# Patient Record
Sex: Female | Born: 1965 | Race: White | Hispanic: No | Marital: Married | Smoking: Current some day smoker
Health system: Southern US, Community
[De-identification: ages and names within clinical notes are randomized; demographics above are authoritative.]

## PROBLEM LIST (undated history)

## (undated) DIAGNOSIS — I10 Essential (primary) hypertension: Secondary | ICD-10-CM

## (undated) DIAGNOSIS — E119 Type 2 diabetes mellitus without complications: Secondary | ICD-10-CM

## (undated) DIAGNOSIS — F329 Major depressive disorder, single episode, unspecified: Secondary | ICD-10-CM

## (undated) DIAGNOSIS — F419 Anxiety disorder, unspecified: Secondary | ICD-10-CM

## (undated) DIAGNOSIS — F32A Depression, unspecified: Secondary | ICD-10-CM

## (undated) HISTORY — DX: Essential (primary) hypertension: I10

## (undated) HISTORY — PX: NASAL SINUS SURGERY: SHX719

## (undated) HISTORY — DX: Depression, unspecified: F32.A

## (undated) HISTORY — DX: Type 2 diabetes mellitus without complications: E11.9

## (undated) HISTORY — DX: Anxiety disorder, unspecified: F41.9

## (undated) HISTORY — DX: Major depressive disorder, single episode, unspecified: F32.9

---

## 2013-12-22 ENCOUNTER — Ambulatory Visit: Payer: Self-pay | Admitting: Physician Assistant

## 2014-01-27 ENCOUNTER — Ambulatory Visit: Payer: Self-pay | Admitting: Family Medicine

## 2015-01-24 ENCOUNTER — Ambulatory Visit: Payer: Self-pay | Admitting: Family Medicine

## 2015-02-01 ENCOUNTER — Ambulatory Visit
Admit: 2015-02-01 | Disposition: A | Discharge status: Home / Self Care | Payer: Self-pay | Attending: Family Medicine | Admitting: Family Medicine

## 2015-03-04 ENCOUNTER — Ambulatory Visit
Admit: 2015-03-04 | Disposition: A | Discharge status: Home / Self Care | Payer: Self-pay | Attending: Family Medicine | Admitting: Family Medicine

## 2015-04-16 ENCOUNTER — Emergency Department
Admission: EM | Admit: 2015-04-16 | Discharge: 2015-04-16 | Discharge status: Against Medical Advice | Payer: Self-pay | Attending: Emergency Medicine | Admitting: Emergency Medicine

## 2015-04-16 DIAGNOSIS — R05 Cough: Secondary | ICD-10-CM | POA: Insufficient documentation

## 2015-04-16 DIAGNOSIS — Y998 Other external cause status: Secondary | ICD-10-CM | POA: Insufficient documentation

## 2015-04-16 DIAGNOSIS — S299XXA Unspecified injury of thorax, initial encounter: Secondary | ICD-10-CM | POA: Insufficient documentation

## 2015-04-16 DIAGNOSIS — Y9289 Other specified places as the place of occurrence of the external cause: Secondary | ICD-10-CM | POA: Insufficient documentation

## 2015-04-16 DIAGNOSIS — X58XXXA Exposure to other specified factors, initial encounter: Secondary | ICD-10-CM | POA: Insufficient documentation

## 2015-04-16 DIAGNOSIS — Y9389 Activity, other specified: Secondary | ICD-10-CM | POA: Insufficient documentation

## 2015-04-16 NOTE — ED Notes (Signed)
Pt in with co left rib pain, states was moving around on the cough when she felt a "pop" to left rib area.  Now having pain to area, pain increases with movement and inspiration.

## 2015-06-15 ENCOUNTER — Encounter: Payer: Self-pay | Admitting: Family Medicine

## 2015-06-15 ENCOUNTER — Ambulatory Visit (INDEPENDENT_AMBULATORY_CARE_PROVIDER_SITE_OTHER): Payer: BLUE CROSS/BLUE SHIELD | Admitting: Family Medicine

## 2015-06-15 VITALS — BP 110/88 | HR 80 | Temp 98.3°F | Resp 16 | Ht 61.0 in | Wt 203.2 lb

## 2015-06-15 DIAGNOSIS — E1165 Type 2 diabetes mellitus with hyperglycemia: Secondary | ICD-10-CM | POA: Diagnosis not present

## 2015-06-15 DIAGNOSIS — F329 Major depressive disorder, single episode, unspecified: Secondary | ICD-10-CM | POA: Insufficient documentation

## 2015-06-15 DIAGNOSIS — IMO0002 Reserved for concepts with insufficient information to code with codable children: Secondary | ICD-10-CM | POA: Insufficient documentation

## 2015-06-15 DIAGNOSIS — F988 Other specified behavioral and emotional disorders with onset usually occurring in childhood and adolescence: Secondary | ICD-10-CM | POA: Insufficient documentation

## 2015-06-15 DIAGNOSIS — M5030 Other cervical disc degeneration, unspecified cervical region: Secondary | ICD-10-CM | POA: Insufficient documentation

## 2015-06-15 DIAGNOSIS — F32A Depression, unspecified: Secondary | ICD-10-CM | POA: Insufficient documentation

## 2015-06-15 DIAGNOSIS — F419 Anxiety disorder, unspecified: Secondary | ICD-10-CM | POA: Insufficient documentation

## 2015-06-15 DIAGNOSIS — I10 Essential (primary) hypertension: Secondary | ICD-10-CM | POA: Insufficient documentation

## 2015-06-15 MED ORDER — GLIPIZIDE 5 MG PO TABS: 5.0000 mg | ORAL_TABLET | Freq: Two times a day (BID) | ORAL | Status: DC

## 2015-06-15 NOTE — Patient Instructions (Signed)
Stop metformin 

## 2015-06-15 NOTE — Progress Notes (Signed)
Subjective:     Patient ID: Michelle Hendricks, female   DOB: 1966/09/29, 49 y.o.   MRN: [id number]  HPI  Chief Complaint  Patient presents with  . Emesis    Patient comes in office today with complaints of nausea, vomiting and diarrhea. She states that she had two episodes this morning of vomiting and that since she had her metformin increased in June she has experienced increased urination at night and leg cramping. Patient reports that once Metformin was increased she had received a steroid shot and since having that shot she has had G.I upset. Patient states that her fasting blood sugar this morning was 209  She has generally not felt well since metformin increased in May. States she may move her bowels up to 6 x day and they are generally not formed. Prior bowel pattern was 1-2 x day.   Review of Systems  Constitutional: Negative for fever and chills.       Objective:   Physical Exam  Constitutional: She appears well-developed and well-nourished. No distress.  Pulmonary/Chest: Breath sounds normal.  Abdominal: Bowel sounds are normal. There is tenderness (diffuse). There is no guarding.       Assessment:    1. Diabetes type 2, uncontrolled-intolerable side effects to metformin - glipiZIDE (GLUCOTROL) 5 MG tablet; Take 1 tablet (5 mg total) by mouth 2 (two) times daily before a meal.  Dispense: 60 tablet; Refill:     Plan:    Stop metformin; start sulfonylurea, f/u with primary provider (or phone f/u) in one week.

## 2015-06-16 ENCOUNTER — Telehealth: Payer: Self-pay | Admitting: Family Medicine

## 2015-06-16 NOTE — Telephone Encounter (Signed)
Pt came in yesterday.  Changed diabetes mediation and her blood sugar this morning has been 191 fasting and 269 after breakfast.  Please call her back.  Thanks tp

## 2015-06-16 NOTE — Telephone Encounter (Signed)
Pt called back because she is still very concerned about the elevated levels. Thanks TNP

## 2015-06-16 NOTE — Telephone Encounter (Signed)
Yes it may take a week for sugars to show improvement and she will be following up with Michelle Hendricks then. I am interested in knowing if her G.I. Sx are improving off metformin?

## 2015-06-16 NOTE — Telephone Encounter (Signed)
Since patient just started medication Im assuming it will take a while for her body to adjust to medication. Please advise on what I should tell patient

## 2015-06-17 NOTE — Telephone Encounter (Signed)
Spoke with patient on the phone she states that her G.I symptoms have improved off Metformin. She states her blood sugar yesterday was >230 but today she says her blood sugar is back to normal at 165 and she is feeling a lot better. Patient advised to follow up with Maurine Ministerennis as suggested and if she has any further concerns to call office back.

## 2015-07-07 ENCOUNTER — Encounter: Payer: Self-pay | Admitting: Family Medicine

## 2015-07-07 ENCOUNTER — Ambulatory Visit (INDEPENDENT_AMBULATORY_CARE_PROVIDER_SITE_OTHER): Payer: BLUE CROSS/BLUE SHIELD | Admitting: Family Medicine

## 2015-07-07 ENCOUNTER — Other Ambulatory Visit: Payer: Self-pay

## 2015-07-07 VITALS — BP 116/72 | HR 84 | Temp 98.1°F | Resp 16 | Wt 203.0 lb

## 2015-07-07 DIAGNOSIS — IMO0002 Reserved for concepts with insufficient information to code with codable children: Secondary | ICD-10-CM

## 2015-07-07 DIAGNOSIS — E1165 Type 2 diabetes mellitus with hyperglycemia: Secondary | ICD-10-CM

## 2015-07-07 DIAGNOSIS — I10 Essential (primary) hypertension: Secondary | ICD-10-CM

## 2015-07-07 NOTE — Progress Notes (Signed)
Patient ID: Michelle Hendricks, female   DOB: 1966-04-13, 49 y.o.   MRN: [id number] .   Chief Complaint  Patient presents with  . Follow-up    diabetes    Subjective:  HPI  This 49 year old female stopped Metformin due to side effects and switched to Glipizide 5 mg BID. Denies numbness/pain in extremities, hypoglycemic attacks, polyuria, polydipsia or polyphagia. Blood sugar had been 150-170 but yesterday FBS elevated to 201 and spiked to 231 in the afternoon. FBS 186 this morning. Feeling dizzy yesterday but not today. Primarily fatigued today.   Prior to Admission medications   Medication Sig Start Date End Date Taking? Authorizing Provider  citalopram (CELEXA) 20 MG tablet Take 20 mg by mouth at bedtime. 06/07/15  Yes Historical Provider, MD  glipiZIDE (GLUCOTROL) 5 MG tablet Take 1 tablet (5 mg total) by mouth 2 (two) times daily before a meal. 06/15/15  Yes Anola Gurney, PA  ibuprofen (ADVIL,MOTRIN) 800 MG tablet as needed 04/08/15  Yes Historical Provider, MD  lisinopril-hydrochlorothiazide (PRINZIDE,ZESTORETIC) 20-25 MG per tablet Take 1 tablet by mouth daily. 06/07/15  Yes Historical Provider, MD  naproxen (NAPROSYN) 500 MG tablet TAKE 1 TABLET BY ORAL ROUTE 2 TIMES EVERY DAY WITH FOOD 04/17/15  Yes Historical Provider, MD  Letta Pate VERIO test strip USE TO TEST BLOOD SUGAR DAILY 05/03/15  Yes Historical Provider, MD    Patient Active Problem List   Diagnosis Date Noted  . Diabetes type 2, uncontrolled 06/15/2015  . Depression 06/15/2015  . Anxiety 06/15/2015  . Hypertension 06/15/2015  . DDD (degenerative disc disease), cervical 06/15/2015  . ADD (attention deficit disorder) 06/15/2015    Past Medical History  Diagnosis Date  . Anxiety   . Depression   . Hypertension   . Diabetes mellitus without complication     History   Social History  . Marital Status: Single    Spouse Name: N/A  . Number of Children: N/A  . Years of Education: N/A   Occupational History  . Not on  file.   Social History Main Topics  . Smoking status: Never Smoker   . Smokeless tobacco: Not on file  . Alcohol Use: Not on file  . Drug Use: Not on file  . Sexual Activity: Not on file   Other Topics Concern  . Not on file   Social History Narrative    Allergies  Allergen Reactions  . Penicillins Rash   Review of Systems  Constitutional: Negative.   HENT: Negative.   Eyes: Negative.   Respiratory: Negative.   Cardiovascular: Negative.   Genitourinary: Negative.   Skin: Negative.   Endo/Heme/Allergies: Negative.     There is no immunization history on file for this patient. Objective:  BP 116/72 mmHg  Pulse 84  Temp(Src) 98.1 F (36.7 C) (Oral)  Resp 16  Wt 203 lb (92.08 kg)  LMP  (Within Months)  Physical Exam  Constitutional: She is oriented to person, place, and time and well-developed, well-nourished, and in no distress.  HENT:  Head: Normocephalic and atraumatic.  Eyes: EOM are normal. Pupils are equal, round, and reactive to light.  Cardiovascular: Normal rate and regular rhythm.   Pulmonary/Chest: Effort normal.  Abdominal: Soft. Bowel sounds are normal.  Neurological: She is alert and oriented to person, place, and time.  Skin: No rash noted.  Psychiatric: Memory, affect and judgment normal.   Assessment and Plan :   1. Diabetes type 2, uncontrolled Stable on the Glipizide until receiving steroid injections  in her neck recently. Recommend exercise and diabetic diet. Continue Glipizide 5 mg BID. Recheck labs. May need additional medications. Recheck pending reports. - Hemoglobin A1c - CBC with Differential/Platelet - Comprehensive metabolic panel - Lipid panel  2. Essential hypertension Good control on the Lisionpril-HCTZ 20/25 mg qd. No side effects. Recheck routine labs and lipids. Recheck pending reports.   Julieanne Manson MD [facility] 07/07/2015 11:18 AM

## 2015-07-08 ENCOUNTER — Telehealth: Payer: Self-pay | Admitting: Family Medicine

## 2015-07-08 DIAGNOSIS — IMO0002 Reserved for concepts with insufficient information to code with codable children: Secondary | ICD-10-CM

## 2015-07-08 DIAGNOSIS — E1165 Type 2 diabetes mellitus with hyperglycemia: Secondary | ICD-10-CM

## 2015-07-08 LAB — CBC WITH DIFFERENTIAL/PLATELET
Basophils Absolute: 0 10*3/uL (ref 0.0–0.2)
Basos: 0 %
EOS (ABSOLUTE): 0.1 10*3/uL (ref 0.0–0.4)
EOS: 1 %
Hematocrit: 37.6 % (ref 34.0–46.6)
Hemoglobin: 13.1 g/dL (ref 11.1–15.9)
Immature Grans (Abs): 0 10*3/uL (ref 0.0–0.1)
Immature Granulocytes: 0 %
LYMPHS ABS: 2 10*3/uL (ref 0.7–3.1)
Lymphs: 22 %
MCH: 30.5 pg (ref 26.6–33.0)
MCHC: 34.8 g/dL (ref 31.5–35.7)
MCV: 87 fL (ref 79–97)
MONOCYTES: 5 %
MONOS ABS: 0.4 10*3/uL (ref 0.1–0.9)
Neutrophils Absolute: 6.5 10*3/uL (ref 1.4–7.0)
Neutrophils: 72 %
Platelets: 296 10*3/uL (ref 150–379)
RBC: 4.3 x10E6/uL (ref 3.77–5.28)
RDW: 15 % (ref 12.3–15.4)
WBC: 9.1 10*3/uL (ref 3.4–10.8)

## 2015-07-08 LAB — LIPID PANEL
Chol/HDL Ratio: 8.7 ratio units — ABNORMAL HIGH (ref 0.0–4.4)
Cholesterol, Total: 183 mg/dL (ref 100–199)
HDL: 21 mg/dL — ABNORMAL LOW (ref >39)
Triglycerides: 1134 mg/dL (ref 0–149)

## 2015-07-08 LAB — COMPREHENSIVE METABOLIC PANEL
ALK PHOS: 73 IU/L (ref 39–117)
ALT: 17 IU/L (ref 0–32)
AST: 18 IU/L (ref 0–40)
Albumin/Globulin Ratio: 1.5 (ref 1.1–2.5)
Albumin: 4 g/dL (ref 3.5–5.5)
BUN / CREAT RATIO: 18 (ref 9–23)
BUN: 12 mg/dL (ref 6–24)
CHLORIDE: 97 mmol/L (ref 97–108)
CO2: 20 mmol/L (ref 18–29)
CREATININE: 0.66 mg/dL (ref 0.57–1.00)
Calcium: 8.8 mg/dL (ref 8.7–10.2)
GFR calc Af Amer: 122 mL/min/{1.73_m2} (ref >59)
GFR, EST NON AFRICAN AMERICAN: 106 mL/min/{1.73_m2} (ref >59)
GLUCOSE: 180 mg/dL — AB (ref 65–99)
Globulin, Total: 2.7 g/dL (ref 1.5–4.5)
Potassium: 4.3 mmol/L (ref 3.5–5.2)
Sodium: 136 mmol/L (ref 134–144)
TOTAL PROTEIN: 6.7 g/dL (ref 6.0–8.5)

## 2015-07-08 LAB — HEMOGLOBIN A1C
Est. average glucose Bld gHb Est-mCnc: 177 mg/dL
Hgb A1c MFr Bld: 7.8 % — ABNORMAL HIGH (ref 4.8–5.6)

## 2015-07-08 NOTE — Telephone Encounter (Signed)
Pt called for test results

## 2015-07-08 NOTE — Telephone Encounter (Signed)
Sorry for the mistake. Should increase Glipizide to 5 mg to 3 times a day. Proceed with treatment of high triglycerides with the Fenofibrate and Metamucil. Getting the blood sugar down may lower the triglycerides also. Recheck levels in 6-8 week.

## 2015-07-08 NOTE — Telephone Encounter (Signed)
-----   Message from Tamsen Roers, [place] sent at 07/08/2015  6:04 PM EDT ----- Hgb A1C high at 7.8 and glucose high at 180. Very good kidney and liver function tests. Total cholesterol is normal. HDL cholesterol very low and triglycerides extremely high. Need to increase Metformin to 1000 mg BID, add Fenofibrate 160 mg qd #30 & 3 RF and Metamucil 1 teaspoon in 4-6 ounces of juice or water. Watch diet closely and lose weight. Recheck levels in 6-8 weeks.

## 2015-07-08 NOTE — Telephone Encounter (Signed)
Advised pt of the note below, pt stated that she is not taking metformin, she said that she is taking glypizide 5 mg. Please advise.

## 2015-07-11 MED ORDER — GLIPIZIDE 5 MG PO TABS: 5.0000 mg | ORAL_TABLET | Freq: Three times a day (TID) | ORAL | Status: DC

## 2015-07-11 NOTE — Telephone Encounter (Signed)
Patient advised as directed below. Patient verbalized understanding and agrees with treatment plan. Patient requested a new RX reflecting dose increase be sent to pharmacy. RX sent to [facility].

## 2015-08-02 ENCOUNTER — Telehealth: Payer: Self-pay | Admitting: Family Medicine

## 2015-08-02 NOTE — Telephone Encounter (Signed)
Patient states her blood sugars are elevated between 200-260 and some days up to 300. Patient states when her sugars do drop they drop to fast and decrease to 70-100. Patient states she is really tired and fatigue.   Patient states typically when she starts new medications she has good symptom control then the medications tend to become less effective over time.  Please advise.

## 2015-08-02 NOTE — Telephone Encounter (Signed)
Pt called saying she does not think the glipizide is not working any longer.  She said she has been getting higher sugar reading lately.  Please call her back  [phone].  Thanks Barth Kirks

## 2015-08-03 NOTE — Telephone Encounter (Signed)
Pt called wanting to know what the doctor wants her to do.  Please advise.  Sugar is not as high today, but she still feels bad.    Her call back [phone]   Thanks teri

## 2015-08-03 NOTE — Telephone Encounter (Signed)
Patient advised Michelle Hendricks is out of the office today. Patient states her blood sugar is not elevated today. Advised patient that we would call her on Thursday with a recommendation. Please advise.

## 2015-08-04 MED ORDER — SAXAGLIPTIN HCL 2.5 MG PO TABS: 2.5000 mg | ORAL_TABLET | Freq: Every day | ORAL | Status: DC

## 2015-08-04 NOTE — Telephone Encounter (Signed)
Pt stated that her sugar this am was 200 fasting and that if keeps going up. Pt stated that she is taking glipizide 5 mg daily before meals. She wants to know what to do. Please advise.  Thanks,

## 2015-08-04 NOTE — Telephone Encounter (Signed)
Having light headed/dizzy sensations since taking the Glipizide. Had too much GI side effects to take the Metformin. Will discontinue the Glipizide and give trial of Onglyza  2.5 mg qd. Recheck appointment in 5-7 days. Patient agrees and will continue to check FBS each morning with recheck during the day if any dizziness.

## 2015-08-04 NOTE — Telephone Encounter (Signed)
Pt is requesting a call back.  UJ#[phone]/FA

## 2015-08-11 ENCOUNTER — Other Ambulatory Visit: Payer: Self-pay

## 2015-08-12 ENCOUNTER — Ambulatory Visit (INDEPENDENT_AMBULATORY_CARE_PROVIDER_SITE_OTHER): Payer: BLUE CROSS/BLUE SHIELD | Admitting: Family Medicine

## 2015-08-12 ENCOUNTER — Encounter: Payer: Self-pay | Admitting: Family Medicine

## 2015-08-12 VITALS — BP 122/72 | HR 110 | Temp 98.4°F | Resp 18 | Wt 204.2 lb

## 2015-08-12 DIAGNOSIS — E1165 Type 2 diabetes mellitus with hyperglycemia: Secondary | ICD-10-CM | POA: Diagnosis not present

## 2015-08-12 DIAGNOSIS — Z634 Disappearance and death of family member: Secondary | ICD-10-CM

## 2015-08-12 DIAGNOSIS — IMO0002 Reserved for concepts with insufficient information to code with codable children: Secondary | ICD-10-CM

## 2015-08-12 MED ORDER — SAXAGLIPTIN HCL 2.5 MG PO TABS: 5.0000 mg | ORAL_TABLET | Freq: Every day | ORAL | Status: DC

## 2015-08-12 MED ORDER — LORAZEPAM 0.5 MG PO TABS: 0.5000 mg | ORAL_TABLET | Freq: Three times a day (TID) | ORAL | Status: DC | PRN

## 2015-08-12 NOTE — Progress Notes (Addendum)
Patient ID: Michelle Hendricks, female   DOB: 07-07-1966, 49 y.o.   MRN: [id number]    Subjective:  Diabetes She presents for her follow-up diabetic visit. She has type 2 diabetes mellitus. Her disease course has been fluctuating. Hypoglycemia symptoms include nervousness/anxiousness.  FBS has been ranging from 150-211 over the past week. Tolerating the Onglyza 2.5 mg qd without side effects. Appetite has been up and down since father died in [place] last week from a heart attack unexpectedly.   Prior to Admission medications   Medication Sig Start Date End Date Taking? Authorizing Provider  citalopram (CELEXA) 20 MG tablet Take 20 mg by mouth at bedtime. 06/07/15  Yes Historical Provider, MD  ibuprofen (ADVIL,MOTRIN) 800 MG tablet as needed 04/08/15  Yes Historical Provider, MD  lisinopril-hydrochlorothiazide (PRINZIDE,ZESTORETIC) 20-25 MG per tablet Take 1 tablet by mouth daily. 06/07/15  Yes Historical Provider, MD  naproxen (NAPROSYN) 500 MG tablet TAKE 1 TABLET BY ORAL ROUTE 2 TIMES EVERY DAY WITH FOOD 04/17/15  Yes Historical Provider, MD  Letta Pate VERIO test strip USE TO TEST BLOOD SUGAR DAILY 05/03/15  Yes Historical Provider, MD  saxagliptin HCl (ONGLYZA) 2.5 MG TABS tablet Take 1 tablet (2.5 mg total) by mouth daily. 08/04/15  Yes Tamsen Roers, PA    Patient Active Problem List   Diagnosis Date Noted  . Diabetes type 2, uncontrolled 06/15/2015  . Depression 06/15/2015  . Anxiety 06/15/2015  . Hypertension 06/15/2015  . DDD (degenerative disc disease), cervical 06/15/2015  . ADD (attention deficit disorder) 06/15/2015    Past Medical History  Diagnosis Date  . Anxiety   . Depression   . Hypertension   . Diabetes mellitus without complication    Past Surgical History  Procedure Laterality Date  . Nasal sinus surgery     Family History  Problem Relation Age of Onset  . Alzheimer's disease Maternal Grandmother   . Cancer Maternal Grandfather   . Diabetes Maternal Grandfather     . Heart attack Father    Social History   Social History  . Marital Status: Single    Spouse Name: N/A  . Number of Children: N/A  . Years of Education: N/A   Occupational History  . Not on file.   Social History Main Topics  . Smoking status: Never Smoker   . Smokeless tobacco: Not on file  . Alcohol Use: Not on file  . Drug Use: Not on file  . Sexual Activity: Not on file   Other Topics Concern  . Not on file   Social History Narrative    Allergies  Allergen Reactions  . Penicillins Rash    Review of Systems  Constitutional: Positive for malaise/fatigue.  HENT: Negative.   Respiratory: Negative.   Cardiovascular: Negative.   Gastrointestinal: Negative.   Psychiatric/Behavioral: The patient is nervous/anxious.        Father died suddenly from heart attack last week at his home in [place].   Objective:  BP 122/72 mmHg  Pulse 110  Temp(Src) 98.4 F (36.9 C) (Oral)  Resp 18  Wt 204 lb 3.2 oz (92.625 kg)  SpO2 95%  Physical Exam  Constitutional: She is well-developed, well-nourished, and in no distress.  HENT:  Head: Normocephalic.  Eyes: Conjunctivae are normal.  Neck: Normal range of motion. Neck supple.  Cardiovascular: Normal rate and regular rhythm.   Pulmonary/Chest: Breath sounds normal.  Abdominal: Soft. Bowel sounds are normal.  Musculoskeletal: Normal range of motion.  Skin: No rash noted.  Psychiatric: Memory  and judgment normal. Her mood appears anxious. She exhibits a depressed mood. She expresses no suicidal ideation. She expresses no suicidal plans. She has a flat affect.    Lab Results  Component Value Date   WBC 9.1 07/07/2015   HCT 37.6 07/07/2015   GLUCOSE 180* 07/07/2015   CHOL 183 07/07/2015   TRIG 1134* 07/07/2015   HDL 21* 07/07/2015   LDLCALC Comment 07/07/2015   HGBA1C 7.8* 07/07/2015    CMP     Component Value Date/Time   NA 136 07/07/2015 1415   K 4.3 07/07/2015 1415   CL 97 07/07/2015 1415   CO2 20 07/07/2015  1415   GLUCOSE 180* 07/07/2015 1415   BUN 12 07/07/2015 1415   CREATININE 0.66 07/07/2015 1415   CALCIUM 8.8 07/07/2015 1415   PROT 6.7 07/07/2015 1415   AST 18 07/07/2015 1415   ALT 17 07/07/2015 1415   ALKPHOS 73 07/07/2015 1415   BILITOT <0.2 07/07/2015 1415   GFRNONAA 106 07/07/2015 1415   GFRAA 122 07/07/2015 1415    Assessment and Plan :  1. Diabetes type 2, uncontrolled Tolerating Onglyza but having difficulty maintaining diet with recent death of her father. Will increase Onglyza to 5 mg qd and recheck in 1 months. Encouraged to maintain proper diabetic diet. - saxagliptin HCl (ONGLYZA) 2.5 MG TABS tablet; Take 2 tablets (5 mg total) by mouth daily.  Dispense: 21 tablet; Refill: 0  2. Bereavement Frequent anxiety and spontaneous crying spells. Will give Lorazepam to calm nervousness/anxiety. Should help with sleep but must be aware of drowsiness from this medication before attempting to drive. Recheck prn. - LORazepam (ATIVAN) 0.5 MG tablet; Take 1 tablet (0.5 mg total) by mouth 3 (three) times daily as needed for anxiety.  Dispense: 30 tablet; Refill: 1   Sheliah Plane [facility] 08/12/2015 3:04 PM

## 2015-08-23 ENCOUNTER — Telehealth: Payer: Self-pay | Admitting: Family Medicine

## 2015-08-23 NOTE — Telephone Encounter (Signed)
Offered to add SGLT-2 to her DPP-4 since she did not tolerate the GI upset from the Metformin. Also, offered to start Lantus or Toujeo. States she wants to set up her own appointment with an endocrinologist before trying other treatments. Advised her we could help with scheduling referral but she was sure her insurance would allow her to do it herself.

## 2015-08-23 NOTE — Telephone Encounter (Signed)
Pt called to say her blood sugar reading have been high today/  Fasting 264,   After breakfast  203  , after lunch 271.  Please advise  Call back is [phone]  Thanks teri

## 2015-08-24 ENCOUNTER — Telehealth: Payer: Self-pay | Admitting: Family Medicine

## 2015-08-24 DIAGNOSIS — E1165 Type 2 diabetes mellitus with hyperglycemia: Secondary | ICD-10-CM

## 2015-08-24 DIAGNOSIS — IMO0002 Reserved for concepts with insufficient information to code with codable children: Secondary | ICD-10-CM

## 2015-08-24 NOTE — Telephone Encounter (Signed)
Pt is requesting referral to see Dr Tedd Sias for diabetes.She states that she spoke to you about this yesterday

## 2015-08-25 NOTE — Telephone Encounter (Signed)
Information faxed to Dr Tedd Sias.Her office will contact pt

## 2015-08-25 NOTE — Telephone Encounter (Signed)
Uncontrolled diabetes despite trials of Metformin (stopped due to side effects) and Onglyza. Did not want to start insulin without seeing a diabetic specialist first. Felt her insurance would allow her to set her own appointment when we talked about it earlier this week. Please schedule appointment.

## 2015-09-08 ENCOUNTER — Other Ambulatory Visit: Payer: Self-pay | Admitting: Family Medicine

## 2015-11-11 ENCOUNTER — Emergency Department
Admission: EM | Admit: 2015-11-11 | Discharge: 2015-11-11 | Disposition: A | Discharge status: Home / Self Care | Payer: BLUE CROSS/BLUE SHIELD | Attending: Emergency Medicine | Admitting: Emergency Medicine

## 2015-11-11 ENCOUNTER — Encounter: Payer: Self-pay | Admitting: Emergency Medicine

## 2015-11-11 DIAGNOSIS — E1165 Type 2 diabetes mellitus with hyperglycemia: Secondary | ICD-10-CM | POA: Diagnosis not present

## 2015-11-11 DIAGNOSIS — Z3202 Encounter for pregnancy test, result negative: Secondary | ICD-10-CM | POA: Diagnosis not present

## 2015-11-11 DIAGNOSIS — F17210 Nicotine dependence, cigarettes, uncomplicated: Secondary | ICD-10-CM | POA: Insufficient documentation

## 2015-11-11 DIAGNOSIS — Z791 Long term (current) use of non-steroidal anti-inflammatories (NSAID): Secondary | ICD-10-CM | POA: Diagnosis not present

## 2015-11-11 DIAGNOSIS — Z880 Allergy status to penicillin: Secondary | ICD-10-CM | POA: Diagnosis not present

## 2015-11-11 DIAGNOSIS — I10 Essential (primary) hypertension: Secondary | ICD-10-CM | POA: Insufficient documentation

## 2015-11-11 DIAGNOSIS — R739 Hyperglycemia, unspecified: Secondary | ICD-10-CM

## 2015-11-11 DIAGNOSIS — Z79899 Other long term (current) drug therapy: Secondary | ICD-10-CM | POA: Insufficient documentation

## 2015-11-11 LAB — URINALYSIS COMPLETE WITH MICROSCOPIC (ARMC ONLY)
BILIRUBIN URINE: NEGATIVE
Hgb urine dipstick: NEGATIVE
Ketones, ur: NEGATIVE mg/dL
Leukocytes, UA: NEGATIVE
NITRITE: NEGATIVE
Protein, ur: NEGATIVE mg/dL
Specific Gravity, Urine: 1.014 (ref 1.005–1.030)
pH: 6 (ref 5.0–8.0)

## 2015-11-11 LAB — BASIC METABOLIC PANEL WITH GFR
Anion gap: 11 (ref 5–15)
BUN: 16 mg/dL (ref 6–20)
CO2: 25 mmol/L (ref 22–32)
Calcium: 9.6 mg/dL (ref 8.9–10.3)
Chloride: 99 mmol/L — ABNORMAL LOW (ref 101–111)
Creatinine, Ser: 0.73 mg/dL (ref 0.44–1.00)
GFR calc Af Amer: >60 mL/min (ref >60)
GFR calc non Af Amer: >60 mL/min (ref >60)
Glucose, Bld: 272 mg/dL — ABNORMAL HIGH (ref 65–99)
Potassium: 4.8 mmol/L (ref 3.5–5.1)
Sodium: 135 mmol/L (ref 135–145)

## 2015-11-11 LAB — CBC WITH DIFFERENTIAL/PLATELET
Basophils Absolute: 0 K/uL (ref 0–0.1)
Basophils Relative: 1 %
Eosinophils Absolute: 0.1 K/uL (ref 0–0.7)
Eosinophils Relative: 1 %
HCT: 43 % (ref 35.0–47.0)
Hemoglobin: 14.4 g/dL (ref 12.0–16.0)
Lymphocytes Relative: 23 %
Lymphs Abs: 1.7 K/uL (ref 1.0–3.6)
MCH: 29.4 pg (ref 26.0–34.0)
MCHC: 33.4 g/dL (ref 32.0–36.0)
MCV: 88.2 fL (ref 80.0–100.0)
Monocytes Absolute: 0.3 K/uL (ref 0.2–0.9)
Monocytes Relative: 5 %
Neutro Abs: 5.2 K/uL (ref 1.4–6.5)
Neutrophils Relative %: 70 %
Platelets: 282 K/uL (ref 150–440)
RBC: 4.88 MIL/uL (ref 3.80–5.20)
RDW: 14.3 % (ref 11.5–14.5)
WBC: 7.4 K/uL (ref 3.6–11.0)

## 2015-11-11 LAB — POCT PREGNANCY, URINE: PREG TEST UR: NEGATIVE

## 2015-11-11 LAB — GLUCOSE, CAPILLARY
Glucose-Capillary: 274 mg/dL — ABNORMAL HIGH (ref 65–99)
Glucose-Capillary: 162 mg/dL — ABNORMAL HIGH (ref 65–99)

## 2015-11-11 MED ORDER — INSULIN ASPART 100 UNIT/ML ~~LOC~~ SOLN
6.0000 [IU] | Freq: Once | SUBCUTANEOUS | Status: AC
  Administered 2015-11-11: 6 [IU] via SUBCUTANEOUS
  Filled 2015-11-11: qty 6

## 2015-11-11 MED ORDER — SODIUM CHLORIDE 0.9 % IV BOLUS (SEPSIS)
1000.0000 mL | Freq: Once | INTRAVENOUS | Status: AC
  Administered 2015-11-11: 1000 mL via INTRAVENOUS

## 2015-11-11 NOTE — Discharge Instructions (Signed)
Call your diabetic doctor and follow up with them today for further management of your diabetes. Return to the emergency room for high fever or persistent vomiting or if you feel worse in any way

## 2015-11-11 NOTE — ED Provider Notes (Signed)
[facility] Emergency Department Provider Note  ____________________________________________   I have reviewed the triage vital signs and the nursing notes.   HISTORY  Chief Complaint Hyperglycemia    HPI Michelle Hendricks is a 49 y.o. female who is a known diabetic, diagnosed last March, failed oral medications and has not been started on insulin. Fortunately, she was for some reason unable to tolerate one of the insulin complications that she was on because she was getting a yeast infection, so they took her off that and started her only on an evening dose of 15 units of a long-acting insulin. That was this week. Patient's noticed since that time her sugars a bit high. Sometimes when right ear sometimes over 300. That her body can tell the difference in her sugars are high. She has had no fever no chills no nausea no vomiting or abdominal pain or dysuria no urinary frequency no vaginal discharge denies pregnancy and does not feel otherwise unwell also for the fact that she feels that her sugar is poorly controlled. She does have an endocrinologist with him she has been working, however she was unable to get ahold of that person so she came to the emergency department.    Past Medical History  Diagnosis Date  . Anxiety   . Depression   . Hypertension   . Diabetes mellitus without complication [facility])     Patient Active Problem List   Diagnosis Date Noted  . Diabetes type 2, uncontrolled (HCC) 06/15/2015  . Depression 06/15/2015  . Anxiety 06/15/2015  . Hypertension 06/15/2015  . DDD (degenerative disc disease), cervical 06/15/2015  . ADD (attention deficit disorder) 06/15/2015    Past Surgical History  Procedure Laterality Date  . Nasal sinus surgery      Current Outpatient Rx  Name  Route  Sig  Dispense  Refill  . citalopram (CELEXA) 20 MG tablet      TAKE 1 TABLET BY MOUTH AT BEDTIME   30 tablet   3   . ibuprofen (ADVIL,MOTRIN) 800 MG tablet     as needed      0   . lisinopril-hydrochlorothiazide (PRINZIDE,ZESTORETIC) 20-25 MG tablet      TAKE 1 TABLET BY MOUTH EVERY DAY   30 tablet   3   . LORazepam (ATIVAN) 0.5 MG tablet   Oral   Take 1 tablet (0.5 mg total) by mouth 3 (three) times daily as needed for anxiety.   30 tablet   1   . naproxen (NAPROSYN) 500 MG tablet      TAKE 1 TABLET BY ORAL ROUTE 2 TIMES EVERY DAY WITH FOOD      0   . ONETOUCH VERIO test strip      USE TO TEST BLOOD SUGAR DAILY      3     Dispense as written.   . saxagliptin HCl (ONGLYZA) 2.5 MG TABS tablet   Oral   Take 2 tablets (5 mg total) by mouth daily.   21 tablet   0     LOT# [id number] EXP Feb.2017     Allergies Penicillins  Family History  Problem Relation Age of Onset  . Alzheimer's disease Maternal Grandmother   . Cancer Maternal Grandfather   . Diabetes Maternal Grandfather   . Heart attack Father     Social History Social History  Substance Use Topics  . Smoking status: Current Some Day Smoker -- 0.50 packs/day    Types: Cigarettes  . Smokeless tobacco:  None  . Alcohol Use: No    Review of Systems Constitutional: No fever/chills Eyes: No visual changes. ENT: No sore throat. No stiff neck no neck pain Cardiovascular: Denies chest pain. Respiratory: Denies shortness of breath. Gastrointestinal:   no vomiting.  No diarrhea.  No constipation. Genitourinary: Negative for dysuria. Musculoskeletal: Negative lower extremity swelling Skin: Negative for rash. Neurological: Negative for headaches, focal weakness or numbness. 10-point ROS otherwise negative.  ____________________________________________   PHYSICAL EXAM:  VITAL SIGNS: ED Triage Vitals  Enc Vitals Group     BP 11/11/15 0904 136/73 mmHg     Pulse Rate 11/11/15 0904 89     Resp 11/11/15 0904 20     Temp 11/11/15 0904 98.2 F (36.8 C)     Temp Source 11/11/15 0904 Oral     SpO2 11/11/15 0904 98 %     Weight 11/11/15 0858 199 lb (90.266  kg)     Height 11/11/15 0858 5\' 1"  (1.549 m)     Head Cir --      Peak Flow --      Pain Score --      Pain Loc --      Pain Edu? --      Excl. in GC? --     Constitutional: Alert and oriented. Well appearing and in no acute distress. Eyes: Conjunctivae are normal. PERRL. EOMI. Head: Atraumatic. Nose: No congestion/rhinnorhea. Mouth/Throat: Mucous membranes are moist.  Oropharynx non-erythematous. Neck: No stridor.   Nontender with no meningismus Cardiovascular: Normal rate, regular rhythm. Grossly normal heart sounds.  Good peripheral circulation. Respiratory: Normal respiratory effort.  No retractions. Lungs CTAB. Abdominal: Soft and nontender. No distention. No guarding no rebound Back:  There is no focal tenderness or step off there is no midline tenderness there are no lesions noted. there is no CVA tenderness Musculoskeletal: No lower extremity tenderness. No joint effusions, no DVT signs strong distal pulses no edema Neurologic:  Normal speech and language. No gross focal neurologic deficits are appreciated.  Skin:  Skin is warm, dry and intact. No rash noted. Psychiatric: Mood and affect are normal. Speech and behavior are normal.  ____________________________________________   LABS (all labs ordered are listed, but only abnormal results are displayed)  Labs Reviewed  GLUCOSE, CAPILLARY - Abnormal; Notable for the following:    Glucose-Capillary 274 (*)    All other components within normal limits  BASIC METABOLIC PANEL  URINALYSIS COMPLETEWITH MICROSCOPIC ([facility] ONLY)  CBC WITH DIFFERENTIAL/PLATELET  POC URINE PREG, ED   ____________________________________________  EKG  I personally interpreted any EKGs ordered by me or triage Normal sinus rhythm rate 90 bpm no acute ST elevation no acute ST depression normal axis unremarkable EKG  ____________________________________________  RADIOLOGY  I reviewed any imaging ordered by me or triage that were performed  during my shift ____________________________________________   PROCEDURES  Procedure(s) performed: None  Critical Care performed: None  ____________________________________________   INITIAL IMPRESSION / ASSESSMENT AND PLAN / ED COURSE  Pertinent labs & imaging results that were available during my care of the patient were reviewed by me and considered in my medical decision making (see chart for details).  Patient with some degree of symptomatic elevated blood glucose in the context of not taking some of her insulin after consultation with her physician. No other emergency condition is at this time identified. We will send a BMP and basic blood work to ensure that there is no evidence of a anion gap but I have low suspicion  for DKA. We'll give her IV fluid as anytime if sugars elevated we will anticipate dehydration. We'll check renal function. I will give her insulin and we'll check a pregnancy and a urinalysis and a fall that looks okay will get her back to follow closely with her endocrinologist who is actively managing this problem.  ____________________________________________   FINAL CLINICAL IMPRESSION(S) / ED DIAGNOSES  Final diagnoses:  None     Jeanmarie Plant, MD 11/11/15 [phone]

## 2015-11-11 NOTE — ED Notes (Signed)
Pt states 2 days ago she started feeling "drunk," sugar read 350, began feeling the same way this am, took her sugar at home and the reading was 294. Pt is on insulin shots at home, she tried to call her endocrinologist and they said she needed to be evaluated in the ER. Pt is tearful in triage.

## 2015-11-13 LAB — URINE CULTURE

## 2016-01-09 ENCOUNTER — Ambulatory Visit (INDEPENDENT_AMBULATORY_CARE_PROVIDER_SITE_OTHER): Payer: BLUE CROSS/BLUE SHIELD | Admitting: Family Medicine

## 2016-01-09 ENCOUNTER — Encounter: Payer: Self-pay | Admitting: Family Medicine

## 2016-01-09 VITALS — BP 122/84 | HR 93 | Temp 99.1°F | Resp 16 | Wt 188.0 lb

## 2016-01-09 DIAGNOSIS — R5382 Chronic fatigue, unspecified: Secondary | ICD-10-CM | POA: Diagnosis not present

## 2016-01-09 DIAGNOSIS — E119 Type 2 diabetes mellitus without complications: Secondary | ICD-10-CM | POA: Diagnosis not present

## 2016-01-09 NOTE — Progress Notes (Signed)
Patient ID: Michelle Hendricks, female   DOB: 1966-03-26, 50 y.o.   MRN: [id number]   Patient: Michelle Hendricks Female    DOB: 1966-10-14   50 y.o.   MRN: [id number] Visit Date: 01/09/2016  Today's Provider: Dortha Kern, PA   Chief Complaint  Patient presents with  . Fatigue   Subjective:    HPI Patient reports fatigue X 2 weeks, progressively worsening. Patient states she has lost a total of 20 pounds in the past 1-2 months unintentionally. Patient denies any other symptoms. Denies polydipsia, polyphagia or polyuria. Some tingling and discomfort in the left heel when she first stands. Still following up with Dr. Tedd Sias to control diabetes. Presently on Toujeo 26 units each evening and Humalog by a sliding scale. No longer on the [place].    Patient Active Problem List   Diagnosis Date Noted  . Diabetes type 2, uncontrolled (HCC) 06/15/2015  . Depression 06/15/2015  . Anxiety 06/15/2015  . Hypertension 06/15/2015  . DDD (degenerative disc disease), cervical 06/15/2015  . ADD (attention deficit disorder) 06/15/2015   Past Surgical History  Procedure Laterality Date  . Nasal sinus surgery     Family History  Problem Relation Age of Onset  . Alzheimer's disease Maternal Grandmother   . Cancer Maternal Grandfather   . Diabetes Maternal Grandfather   . Heart attack Father     Previous Medications   CITALOPRAM (CELEXA) 20 MG TABLET    Take 20 mg by mouth at bedtime.   GEMFIBROZIL (LOPID) 600 MG TABLET    Take 600 mg by mouth 2 (two) times daily before a meal.   INSULIN GLARGINE (TOUJEO SOLOSTAR [place])    Inject 15 Units into the skin at bedtime.   LISINOPRIL-HYDROCHLOROTHIAZIDE (PRINZIDE,ZESTORETIC) 20-25 MG TABLET    Take 1 tablet by mouth daily.   LORAZEPAM (ATIVAN) 0.5 MG TABLET    Take 1 tablet (0.5 mg total) by mouth 3 (three) times daily as needed for anxiety.   OMEGA-3 FATTY ACIDS (FISH OIL) 1000 MG CAPS    Take 1,000 mg by mouth 3 (three) times daily.   SAXAGLIPTIN HCL (ONGLYZA)  2.5 MG TABS TABLET    Take 2 tablets (5 mg total) by mouth daily.   Allergies  Allergen Reactions  . Penicillins Rash and Other (See Comments)    Has patient had a PCN reaction causing immediate rash, facial/tongue/throat swelling, SOB or lightheadedness with hypotension: No Has patient had a PCN reaction causing severe rash involving mucus membranes or skin necrosis: No Has patient had a PCN reaction that required hospitalization No Has patient had a PCN reaction occurring within the last 10 years: No If all of the above answers are "NO", then may proceed with Cephalosporin use.    Review of Systems  Constitutional: Positive for fatigue and unexpected weight change.  HENT: Negative.   Eyes: Negative.   Respiratory: Negative.   Cardiovascular: Negative.   Gastrointestinal: Negative.   Endocrine: Negative.   Genitourinary: Negative.   Musculoskeletal: Negative.   Skin: Negative.   Allergic/Immunologic: Negative.   Neurological: Negative.   Hematological: Negative.   Psychiatric/Behavioral: Negative.     Social History  Substance Use Topics  . Smoking status: Current Some Day Smoker -- 0.50 packs/day    Types: Cigarettes  . Smokeless tobacco: Not on file  . Alcohol Use: No   Objective:   BP 122/84 mmHg  Pulse 93  Temp(Src) 99.1 F (37.3 C) (Oral)  Resp 16  Wt 188 lb (85.276 kg)  SpO2 97%  LMP  (Within Months)  Physical Exam  Constitutional: She is oriented to person, place, and time. She appears well-developed and well-nourished.  HENT:  Head: Normocephalic.  Right Ear: External ear normal.  Left Ear: External ear normal.  Mouth/Throat: Oropharynx is clear and moist.  Eyes: Conjunctivae and EOM are normal.  Neck: Normal range of motion. Neck supple.  Cardiovascular: Normal rate, regular rhythm and normal heart sounds.   Pulmonary/Chest: Breath sounds normal.  Abdominal: Soft. Bowel sounds are normal.  Musculoskeletal: Normal range of motion. She exhibits  tenderness.  Tender anterior plantar surface of the left heel.  Neurological: She is alert and oriented to person, place, and time.  Psychiatric:  Slightly blunt affect with fatigue.      Assessment & Plan:     1. Chronic fatigue Onset over the past couple weeks. Has been on an additional insulin (Humalog by a sliding scale) for the past 2 months and has lost 20 lbs. States blood sugar has been well controlled without hypoglycemic episodes. Will check for anemia, vitamin-D deficiency or other metabolic disorder. Encouraged to eat meals as planned by Dr. Tedd Sias (endocrinologist) especially with insulin treatment. Recheck pending lab reports. - CBC with Differential/Platelet - TSH - VITAMIN D 25 Hydroxy (Vit-D Deficiency, Fractures)  2. Type 2 diabetes mellitus without complication, unspecified long term insulin use status (HCC) Feels diabetes has been in the best shape as ever. Presently on Humalog by a sliding scale and Toujeo 26 units every evening. Will check Hgb A1C, liver function, kidney function and electrolyte balance. Encouraged to continue routine follow up with Dr. Tedd Sias (endocrinologist) regularly.  - COMPLETE METABOLIC PANEL WITH GFR - Hemoglobin A1c

## 2016-01-11 ENCOUNTER — Telehealth: Payer: Self-pay

## 2016-01-11 LAB — CBC WITH DIFFERENTIAL/PLATELET
BASOS: 0 %
Basophils Absolute: 0 10*3/uL (ref 0.0–0.2)
EOS (ABSOLUTE): 0.1 10*3/uL (ref 0.0–0.4)
EOS: 1 %
HEMATOCRIT: 39.3 % (ref 34.0–46.6)
Hemoglobin: 14 g/dL (ref 11.1–15.9)
IMMATURE GRANS (ABS): 0 10*3/uL (ref 0.0–0.1)
IMMATURE GRANULOCYTES: 0 %
LYMPHS: 24 %
Lymphocytes Absolute: 2 10*3/uL (ref 0.7–3.1)
MCH: 30.6 pg (ref 26.6–33.0)
MCHC: 35.6 g/dL (ref 31.5–35.7)
MCV: 86 fL (ref 79–97)
MONOS ABS: 0.4 10*3/uL (ref 0.1–0.9)
Monocytes: 5 %
NEUTROS ABS: 5.6 10*3/uL (ref 1.4–7.0)
NEUTROS PCT: 70 %
Platelets: 319 10*3/uL (ref 150–379)
RBC: 4.57 x10E6/uL (ref 3.77–5.28)
RDW: 14.3 % (ref 12.3–15.4)
WBC: 8.1 10*3/uL (ref 3.4–10.8)

## 2016-01-11 LAB — CMP14+EGFR
ALT: 20 IU/L (ref 0–32)
AST: 21 IU/L (ref 0–40)
Albumin/Globulin Ratio: 1.5 (ref 1.1–2.5)
Albumin: 4.4 g/dL (ref 3.5–5.5)
Alkaline Phosphatase: 70 IU/L (ref 39–117)
BUN/Creatinine Ratio: 14 (ref 9–23)
BUN: 12 mg/dL (ref 6–24)
Bilirubin Total: 0.3 mg/dL (ref 0.0–1.2)
CALCIUM: 9.9 mg/dL (ref 8.7–10.2)
CO2: 22 mmol/L (ref 18–29)
CREATININE: 0.87 mg/dL (ref 0.57–1.00)
Chloride: 97 mmol/L (ref 96–106)
GFR calc Af Amer: 91 mL/min/{1.73_m2} (ref >59)
GFR, EST NON AFRICAN AMERICAN: 79 mL/min/{1.73_m2} (ref >59)
Globulin, Total: 3 g/dL (ref 1.5–4.5)
Glucose: 158 mg/dL — ABNORMAL HIGH (ref 65–99)
POTASSIUM: 4.5 mmol/L (ref 3.5–5.2)
Sodium: 138 mmol/L (ref 134–144)
Total Protein: 7.4 g/dL (ref 6.0–8.5)

## 2016-01-11 LAB — TSH: TSH: 5.04 u[IU]/mL — ABNORMAL HIGH (ref 0.450–4.500)

## 2016-01-11 LAB — HEMOGLOBIN A1C
ESTIMATED AVERAGE GLUCOSE: 157 mg/dL
HEMOGLOBIN A1C: 7.1 % — AB (ref 4.8–5.6)

## 2016-01-11 LAB — VITAMIN D 25 HYDROXY (VIT D DEFICIENCY, FRACTURES): Vit D, 25-Hydroxy: 23.3 ng/mL — ABNORMAL LOW (ref 30.0–100.0)

## 2016-01-11 NOTE — Telephone Encounter (Signed)
Patient advised as directed below. Patient verbalized understanding.  

## 2016-01-11 NOTE — Telephone Encounter (Signed)
-----   Message from Tamsen Roers, [place] sent at 01/10/2016  5:57 PM EST ----- Partial lab report. No sign of anemia. Awaiting final report of the rest of the labs ordered.

## 2016-01-12 ENCOUNTER — Telehealth: Payer: Self-pay | Admitting: Family Medicine

## 2016-01-12 NOTE — Telephone Encounter (Signed)
See message on lab results and notify patient.

## 2016-01-12 NOTE — Telephone Encounter (Signed)
Advised patient of results.  

## 2016-01-12 NOTE — Telephone Encounter (Signed)
Please review-aa 

## 2016-01-12 NOTE — Telephone Encounter (Signed)
Pt has called back for the results from the rest of her labs.  Her call back is  [phone]  Thanks, Barth Kirks

## 2016-01-14 ENCOUNTER — Other Ambulatory Visit: Payer: Self-pay | Admitting: Family Medicine

## 2016-04-10 ENCOUNTER — Encounter: Payer: Self-pay | Admitting: Family Medicine

## 2016-04-10 ENCOUNTER — Ambulatory Visit (INDEPENDENT_AMBULATORY_CARE_PROVIDER_SITE_OTHER): Payer: BLUE CROSS/BLUE SHIELD | Admitting: Family Medicine

## 2016-04-10 VITALS — BP 124/88 | HR 102 | Temp 99.3°F | Resp 18 | Wt 183.4 lb

## 2016-04-10 DIAGNOSIS — J0100 Acute maxillary sinusitis, unspecified: Secondary | ICD-10-CM

## 2016-04-10 DIAGNOSIS — R52 Pain, unspecified: Secondary | ICD-10-CM

## 2016-04-10 LAB — POCT INFLUENZA A/B
Influenza A, POC: NEGATIVE
Influenza B, POC: NEGATIVE

## 2016-04-10 MED ORDER — AZITHROMYCIN 250 MG PO TABS: ORAL_TABLET | ORAL | Status: DC

## 2016-04-10 NOTE — Progress Notes (Signed)
Patient ID: Michelle Hendricks, female   DOB: 21-Jul-1966, 50 y.o.   MRN: [id number]   Patient: Michelle LionsDawn S Hendricks Female    DOB: 21-Jul-1966   50 y.o.   MRN: [id number] Visit Date: 04/10/2016  Today's Provider: Dortha Kernennis Chrismon, PA   Chief Complaint  Patient presents with  . URI   Subjective:    URI  This is a new problem. Episode onset: 3 days ago. The problem has been gradually worsening. Associated symptoms include congestion, coughing, ear pain (ear fullness), joint pain and a sore throat. Pertinent negatives include no rhinorrhea, sneezing or wheezing. Associated symptoms comments: Scratchy throat with post nasal drip with yellow nasal mucus. Ears feel full and stopped up. Very fatigued.. She has tried acetaminophen, NSAIDs and decongestant for the symptoms. The treatment provided no relief.    Past Medical History  Diagnosis Date  . Anxiety   . Depression   . Hypertension   . Diabetes mellitus without complication [facility](HCC)    Past Surgical History  Procedure Laterality Date  . Nasal sinus surgery     Family History  Problem Relation Age of Onset  . Alzheimer's disease Maternal Grandmother   . Cancer Maternal Grandfather   . Diabetes Maternal Grandfather   . Heart attack Father    Previous Medications   CITALOPRAM (CELEXA) 20 MG TABLET    TAKE 1 TABLET BY MOUTH AT BEDTIME   GEMFIBROZIL (LOPID) 600 MG TABLET    Take 600 mg by mouth 2 (two) times daily before a meal.   HUMALOG KWIKPEN 100 UNIT/ML KIWKPEN    INJECT 2-10 UNITS 3 TIMES A DAY MAX DOSE 30UNITS/DAY   INSULIN GLARGINE (TOUJEO SOLOSTAR [place])    Inject 15 Units into the skin at bedtime.   LISINOPRIL-HYDROCHLOROTHIAZIDE (PRINZIDE,ZESTORETIC) 20-25 MG TABLET    TAKE 1 TABLET BY MOUTH EVERY DAY   LORAZEPAM (ATIVAN) 0.5 MG TABLET    Take 1 tablet (0.5 mg total) by mouth 3 (three) times daily as needed for anxiety.   OMEGA-3 FATTY ACIDS (FISH OIL) 1000 MG CAPS    Take 1,000 mg by mouth 3 (three) times daily.   Allergies  Allergen  Reactions  . Penicillins Rash and Other (See Comments)    Has patient had a PCN reaction causing immediate rash, facial/tongue/throat swelling, SOB or lightheadedness with hypotension: No Has patient had a PCN reaction causing severe rash involving mucus membranes or skin necrosis: No Has patient had a PCN reaction that required hospitalization No Has patient had a PCN reaction occurring within the last 10 years: No If all of the above answers are "NO", then may proceed with Cephalosporin use.    Review of Systems  Constitutional: Positive for fatigue.  HENT: Positive for congestion, ear pain (ear fullness) and sore throat. Negative for rhinorrhea and sneezing.   Eyes: Negative.   Respiratory: Positive for cough. Negative for wheezing.   Cardiovascular: Negative.   Gastrointestinal: Negative.   Endocrine: Negative.   Genitourinary: Negative.   Musculoskeletal: Positive for joint pain.  Skin: Negative.   Allergic/Immunologic: Negative.   Neurological: Negative.   Hematological: Negative.   Psychiatric/Behavioral: Negative.     Social History  Substance Use Topics  . Smoking status: Current Some Day Smoker -- 0.50 packs/day    Types: Cigarettes  . Smokeless tobacco: Not on file  . Alcohol Use: No   Objective:   BP 124/88 mmHg  Pulse 102  Temp(Src) 99.3 F (37.4 C) (Oral)  Resp 18  Wt 183 lb 6.4  oz (83.19 kg)  LMP  (Within Months)  Physical Exam  Constitutional: She is oriented to person, place, and time. She appears well-developed and well-nourished.  HENT:  Head: Normocephalic.  Right Ear: External ear normal.  Left Ear: External ear normal.  Tender maxillary and ethmoid sinus area. Fair transillumination throughout. Slightly reddened nasal membranes and posterior pharynx.  Eyes: Conjunctivae and EOM are normal.  Neck: Normal range of motion. Neck supple.  Cardiovascular: Normal rate, regular rhythm and normal heart sounds.   Pulmonary/Chest: Effort normal and  breath sounds normal.  Abdominal: Soft.  Musculoskeletal:  Soreness to palpate knees and calves bilaterally. No edema. Also, soreness to palpate back and neck muscles.  Lymphadenopathy:    She has no cervical adenopathy.  Neurological: She is alert and oriented to person, place, and time.  Skin: No rash noted.  Psychiatric: She has a normal mood and affect. Her behavior is normal.      Assessment & Plan:     1. Body aches Onset 3 days ago with low grade fever, cough, headache and fatigue with sore throat. Influenza test negative. May use Mucinex-DM for cough and increase fluid intake. Add Tylenol or Advil prn. Recheck prn. - POCT Influenza A/B  2. Subacute maxillary sinusitis Tender maxillary sinuses to palpate. Will treat empirically with Z-pak and recheck if no better in 7-10 days. Due to possible side effect/interaction of azithromycin with citalopram, patient decided to hold citalopram until for the next 10 days. Recheck as needed. - azithromycin (ZITHROMAX) 250 MG tablet; Take 2 tablets by mouth today then one daily for 4 days.  Dispense: 6 tablet; Refill: 0

## 2016-06-17 ENCOUNTER — Other Ambulatory Visit: Payer: Self-pay | Admitting: Family Medicine

## 2016-09-21 LAB — MICROALBUMIN, URINE: MICROALB UR: 18

## 2017-01-22 ENCOUNTER — Other Ambulatory Visit: Payer: Self-pay | Admitting: Family Medicine

## 2017-01-26 DIAGNOSIS — K130 Diseases of lips: Secondary | ICD-10-CM | POA: Diagnosis not present

## 2017-03-07 DIAGNOSIS — F17200 Nicotine dependence, unspecified, uncomplicated: Secondary | ICD-10-CM | POA: Diagnosis not present

## 2017-03-07 DIAGNOSIS — E119 Type 2 diabetes mellitus without complications: Secondary | ICD-10-CM | POA: Diagnosis not present

## 2017-03-07 LAB — HEMOGLOBIN A1C: Hemoglobin A1C: 7.1

## 2017-03-14 DIAGNOSIS — E119 Type 2 diabetes mellitus without complications: Secondary | ICD-10-CM | POA: Diagnosis not present

## 2017-03-14 DIAGNOSIS — M5442 Lumbago with sciatica, left side: Secondary | ICD-10-CM | POA: Diagnosis not present

## 2017-03-14 DIAGNOSIS — F17200 Nicotine dependence, unspecified, uncomplicated: Secondary | ICD-10-CM | POA: Diagnosis not present

## 2017-03-14 DIAGNOSIS — G8929 Other chronic pain: Secondary | ICD-10-CM | POA: Diagnosis not present

## 2017-03-28 DIAGNOSIS — K130 Diseases of lips: Secondary | ICD-10-CM | POA: Diagnosis not present

## 2017-03-28 DIAGNOSIS — B9689 Other specified bacterial agents as the cause of diseases classified elsewhere: Secondary | ICD-10-CM | POA: Diagnosis not present

## 2017-03-28 DIAGNOSIS — J329 Chronic sinusitis, unspecified: Secondary | ICD-10-CM | POA: Diagnosis not present

## 2017-03-28 DIAGNOSIS — H02823 Cysts of right eye, unspecified eyelid: Secondary | ICD-10-CM | POA: Diagnosis not present

## 2017-05-10 ENCOUNTER — Other Ambulatory Visit: Payer: Self-pay | Admitting: Physical Medicine and Rehabilitation

## 2017-05-10 DIAGNOSIS — M5416 Radiculopathy, lumbar region: Secondary | ICD-10-CM | POA: Diagnosis not present

## 2017-05-10 DIAGNOSIS — M5136 Other intervertebral disc degeneration, lumbar region: Secondary | ICD-10-CM | POA: Diagnosis not present

## 2017-05-17 ENCOUNTER — Ambulatory Visit
Admission: RE | Admit: 2017-05-17 | Discharge: 2017-05-17 | Disposition: A | Discharge status: Home / Self Care | Payer: BLUE CROSS/BLUE SHIELD | Source: Ambulatory Visit | Attending: Physical Medicine and Rehabilitation | Admitting: Physical Medicine and Rehabilitation

## 2017-05-17 DIAGNOSIS — M5126 Other intervertebral disc displacement, lumbar region: Secondary | ICD-10-CM | POA: Diagnosis not present

## 2017-05-17 DIAGNOSIS — M5127 Other intervertebral disc displacement, lumbosacral region: Secondary | ICD-10-CM | POA: Diagnosis not present

## 2017-05-17 DIAGNOSIS — M5116 Intervertebral disc disorders with radiculopathy, lumbar region: Secondary | ICD-10-CM | POA: Insufficient documentation

## 2017-05-17 DIAGNOSIS — M5416 Radiculopathy, lumbar region: Secondary | ICD-10-CM

## 2017-05-20 ENCOUNTER — Other Ambulatory Visit: Payer: Self-pay | Admitting: Family Medicine

## 2017-05-27 ENCOUNTER — Ambulatory Visit (INDEPENDENT_AMBULATORY_CARE_PROVIDER_SITE_OTHER): Payer: BLUE CROSS/BLUE SHIELD | Admitting: Family Medicine

## 2017-05-27 ENCOUNTER — Encounter: Payer: Self-pay | Admitting: Family Medicine

## 2017-05-27 VITALS — BP 142/98 | HR 96 | Temp 98.4°F | Wt 186.8 lb

## 2017-05-27 DIAGNOSIS — F419 Anxiety disorder, unspecified: Secondary | ICD-10-CM

## 2017-05-27 DIAGNOSIS — E119 Type 2 diabetes mellitus without complications: Secondary | ICD-10-CM

## 2017-05-27 DIAGNOSIS — I10 Essential (primary) hypertension: Secondary | ICD-10-CM

## 2017-05-27 DIAGNOSIS — M5030 Other cervical disc degeneration, unspecified cervical region: Secondary | ICD-10-CM

## 2017-05-27 DIAGNOSIS — Z720 Tobacco use: Secondary | ICD-10-CM | POA: Insufficient documentation

## 2017-05-27 MED ORDER — LORAZEPAM 0.5 MG PO TABS: 0.5000 mg | ORAL_TABLET | Freq: Three times a day (TID) | ORAL | 1 refills | Status: DC | PRN

## 2017-05-27 NOTE — Progress Notes (Signed)
   Patient: Michelle Hendricks Female    DOB: 1966/05/30   51 y.o.   MRN: [id number] Visit Date: 05/27/2017  Today's Provider: Dortha Kernennis Delcenia Inman, PA   Chief Complaint  Patient presents with  . Depression  . Anxiety  . Follow-up   Subjective:    HPI Depression with Anxiety:  Patient is here for routine follow up. Patient advised to take Celexa 20 mg daily at bedtime. Patient reports good compliance with treatment plan. She reports symptoms are improving since starting Celexa.       Previous Medications   CITALOPRAM (CELEXA) 20 MG TABLET    TAKE 1 TABLET BY MOUTH AT BEDTIME    Review of Systems  Constitutional: Negative.   Respiratory: Negative.   Cardiovascular: Negative.   Psychiatric/Behavioral: Positive for dysphoric mood. The patient is nervous/anxious.     Social History  Substance Use Topics  . Smoking status: Current Some Day Smoker    Packs/day: 0.50    Types: Cigarettes  . Smokeless tobacco: Never Used  . Alcohol use No   Objective:   BP (!) 142/98   Pulse 96   Temp 98.4 F (36.9 C) (Oral)   Wt 186 lb 12.8 oz (84.7 kg)   LMP 04/16/2015   SpO2 99%   BMI 35.30 kg/m  BP Readings from Last 3 Encounters:  05/27/17 (!) 142/98  04/10/16 124/88  01/09/16 122/84   Physical Exam  Constitutional: She is oriented to person, place, and time. She appears well-developed and well-nourished. No distress.  HENT:  Head: Normocephalic and atraumatic.  Right Ear: Hearing normal.  Left Ear: Hearing normal.  Nose: Nose normal.  Eyes: Conjunctivae and lids are normal. Right eye exhibits no discharge. Left eye exhibits no discharge. No scleral icterus.  Neck: Neck supple.  Cardiovascular: Normal rate and regular rhythm.   Pulmonary/Chest: Effort normal and breath sounds normal. No respiratory distress.  Abdominal: Bowel sounds are normal.  Musculoskeletal: Normal range of motion.  Neurological: She is alert and oriented to person, place, and time.  Skin: Skin is intact. No  lesion and no rash noted.  Psychiatric: She has a normal mood and affect. Her speech is normal and behavior is normal. Thought content normal.      Assessment & Plan:     1. Anxiety Still taking Celexa 20 mg qd and occasionally uses Lorazepam if anxiety flares. No excessive drowsiness. Refill Lorazepam and recheck in 6 months. - LORazepam (ATIVAN) 0.5 MG tablet; Take 1 tablet (0.5 mg total) by mouth 3 (three) times daily as needed for anxiety.  Dispense: 30 tablet; Refill: 1  2. Essential hypertension BP elevated today. Has been off the Prinzide for the past 2 months because of concern and side effect issues with spinal steroid injections. Recheck BP in a couple weeks with cardiologist.  3. DDD (degenerative disc disease), cervical Is planning physical therapy as recommended by Dr. Yves Dillhasnis (physiatrist) for chronic neck pain unrelieved by steroid injection by neurosurgeon.  4. Diabetes mellitus without complication (HCC) Followed by Dr. Tedd SiasSolum (endocrinologist) and has been taken off all insulin (Toujeo and Humalog), Lopid and Prinzide. Will be getting follow up labs in 3-4 weeks.

## 2017-05-28 DIAGNOSIS — M545 Low back pain: Secondary | ICD-10-CM | POA: Diagnosis not present

## 2017-05-28 DIAGNOSIS — G8929 Other chronic pain: Secondary | ICD-10-CM | POA: Diagnosis not present

## 2017-07-15 DIAGNOSIS — M5136 Other intervertebral disc degeneration, lumbar region: Secondary | ICD-10-CM | POA: Diagnosis not present

## 2017-07-15 DIAGNOSIS — M5416 Radiculopathy, lumbar region: Secondary | ICD-10-CM | POA: Diagnosis not present

## 2017-08-23 DIAGNOSIS — J320 Chronic maxillary sinusitis: Secondary | ICD-10-CM | POA: Diagnosis not present

## 2017-09-18 ENCOUNTER — Other Ambulatory Visit: Payer: Self-pay | Admitting: Family Medicine

## 2017-09-18 NOTE — Telephone Encounter (Signed)
Last ov 05/27/17 Last filled 05/20/17 Please review. Thank you. sd

## 2017-09-19 NOTE — Telephone Encounter (Signed)
Will refill Citalopram. Also, need copies of recent diabetes labs including urine microalbumen. If not done, need to schedule.

## 2017-09-19 NOTE — Telephone Encounter (Signed)
Left message advising patient that refill has been sent in and to call the office tomorrow or Monday since phones are down to schedule a follow up appointment if she has not had any recent labs done.

## 2017-09-21 DIAGNOSIS — J40 Bronchitis, not specified as acute or chronic: Secondary | ICD-10-CM | POA: Diagnosis not present

## 2017-09-21 DIAGNOSIS — J0100 Acute maxillary sinusitis, unspecified: Secondary | ICD-10-CM | POA: Diagnosis not present

## 2017-10-04 DIAGNOSIS — E119 Type 2 diabetes mellitus without complications: Secondary | ICD-10-CM | POA: Diagnosis not present

## 2017-10-04 LAB — HEMOGLOBIN A1C: HEMOGLOBIN A1C: 8.2

## 2017-10-04 LAB — MICROALBUMIN, URINE: MICROALB UR: 41

## 2017-10-04 LAB — BASIC METABOLIC PANEL
BUN: 17 (ref 4–21)
Creatinine: 0.8 (ref 0.5–1.1)
Glucose: 181
Potassium: 4.7 (ref 3.4–5.3)
Sodium: 137 (ref 137–147)

## 2017-10-04 LAB — LIPID PANEL
CHOLESTEROL: 232 — AB (ref 0–200)
TRIGLYCERIDES: 999 — AB (ref 40–160)

## 2017-10-11 DIAGNOSIS — E119 Type 2 diabetes mellitus without complications: Secondary | ICD-10-CM | POA: Diagnosis not present

## 2017-10-11 DIAGNOSIS — E781 Pure hyperglyceridemia: Secondary | ICD-10-CM | POA: Diagnosis not present

## 2017-10-11 DIAGNOSIS — F17200 Nicotine dependence, unspecified, uncomplicated: Secondary | ICD-10-CM | POA: Diagnosis not present

## 2017-10-31 ENCOUNTER — Encounter: Payer: Self-pay | Admitting: Family Medicine

## 2017-10-31 ENCOUNTER — Ambulatory Visit: Payer: BLUE CROSS/BLUE SHIELD | Admitting: Family Medicine

## 2017-10-31 VITALS — BP 156/96 | HR 98 | Temp 98.4°F | Wt 182.6 lb

## 2017-10-31 DIAGNOSIS — I10 Essential (primary) hypertension: Secondary | ICD-10-CM | POA: Diagnosis not present

## 2017-10-31 DIAGNOSIS — Z1231 Encounter for screening mammogram for malignant neoplasm of breast: Secondary | ICD-10-CM

## 2017-10-31 DIAGNOSIS — F419 Anxiety disorder, unspecified: Secondary | ICD-10-CM | POA: Diagnosis not present

## 2017-10-31 DIAGNOSIS — Z1239 Encounter for other screening for malignant neoplasm of breast: Secondary | ICD-10-CM

## 2017-10-31 DIAGNOSIS — Z114 Encounter for screening for human immunodeficiency virus [HIV]: Secondary | ICD-10-CM

## 2017-10-31 DIAGNOSIS — E119 Type 2 diabetes mellitus without complications: Secondary | ICD-10-CM

## 2017-10-31 MED ORDER — LISINOPRIL 5 MG PO TABS: 5.0000 mg | ORAL_TABLET | Freq: Every day | ORAL | 3 refills | Status: DC

## 2017-10-31 NOTE — Progress Notes (Signed)
Patient: Michelle Hendricks Female    DOB: March 20, 1966   51 y.o.   MRN: [id number] Visit Date: 10/31/2017  Today's Provider: Dortha Kernennis Candida Vetter, PA   Chief Complaint  Patient presents with  . Anxiety  . Depression  . Follow-up   Subjective:    HPI Depression with Anxiety:  Patient is here for routine follow up. Last OV was on 05/27/17. Patient advised to continue Celexa 20 mg daily at bedtime. Patient reports good compliance with treatment plan. Michelle Hendricks reports symptoms are stable with medication. No concerns today.  Patient is followed by Dr. Tedd Hendricks for Diabetes. Last OV was on 10/04/2017. Patient had labs done including lipid panel. Triglycerides were very high at 1358. Patient started TriCor 145 mg.   Patient declined all vaccines today including tetanus, flu and pneumovax. Michelle Hendricks reports having a diabetic eye exam scheduled for 11/06/2017.  Past Medical History:  Diagnosis Date  . Anxiety   . Depression   . Diabetes mellitus without complication (HCC)   . Hypertension    Past Surgical History:  Procedure Laterality Date  . NASAL SINUS SURGERY     Family History  Problem Relation Age of Onset  . Alzheimer's disease Maternal Grandmother   . Cancer Maternal Grandfather   . Diabetes Maternal Grandfather   . Heart attack Father    Allergies  Allergen Reactions  . Penicillins Rash and Other (See Comments)    Has patient had a PCN reaction causing immediate rash, facial/tongue/throat swelling, SOB or lightheadedness with hypotension: No Has patient had a PCN reaction causing severe rash involving mucus membranes or skin necrosis: No Has patient had a PCN reaction that required hospitalization No Has patient had a PCN reaction occurring within the last 10 years: No If all of the above answers are "NO", then may proceed with Cephalosporin use.    Current Outpatient Medications:  .  citalopram (CELEXA) 20 MG tablet, TAKE 1 TABLET BY MOUTH AT BEDTIME, Disp: 30 tablet, Rfl: 3 .   fenofibrate (TRICOR) 145 MG tablet, Take 145 mg by mouth daily., Disp: , Rfl: 11 .  LORazepam (ATIVAN) 0.5 MG tablet, Take 1 tablet (0.5 mg total) by mouth 3 (three) times daily as needed for anxiety., Disp: 30 tablet, Rfl: 1 .  pioglitazone (ACTOS) 15 MG tablet, Take 15 mg by mouth daily., Disp: , Rfl:   Review of Systems  Constitutional: Negative.   Respiratory: Negative.   Cardiovascular: Negative.     Social History   Tobacco Use  . Smoking status: Current Some Day Smoker    Packs/day: 0.50    Types: Cigarettes  . Smokeless tobacco: Never Used  Substance Use Topics  . Alcohol use: No    Alcohol/week: 0.0 oz   Objective:   BP (!) 156/96 (BP Location: Right Arm, Patient Position: Sitting, Cuff Size: Normal)   Pulse 98   Temp 98.4 F (36.9 C) (Oral)   Wt 182 lb 9.6 oz (82.8 kg)   LMP 04/16/2015   SpO2 98%   BMI 34.50 kg/m  Vitals:   10/31/17 1055  BP: (!) 156/96  Pulse: 98  Temp: 98.4 F (36.9 C)  TempSrc: Oral  SpO2: 98%  Weight: 182 lb 9.6 oz (82.8 kg)    Physical Exam  Constitutional: Michelle Hendricks is oriented to person, place, and time. Michelle Hendricks appears well-developed and well-nourished. No distress.  HENT:  Head: Normocephalic and atraumatic.  Right Ear: Hearing normal.  Left Ear: Hearing normal.  Nose: Nose normal.  Eyes: Conjunctivae  and lids are normal. Right eye exhibits no discharge. Left eye exhibits no discharge. No scleral icterus.  Neck: Neck supple.  Cardiovascular: Normal rate and regular rhythm.  Pulmonary/Chest: Effort normal and breath sounds normal. No respiratory distress.  Abdominal: Soft. Bowel sounds are normal.  Musculoskeletal: Normal range of motion.  Neurological: Michelle Hendricks is alert and oriented to person, place, and time.  Skin: Skin is intact. No lesion and no rash noted.  Psychiatric: Michelle Hendricks has a normal mood and affect. Her speech is normal and behavior is normal. Thought content normal.      Assessment & Plan:     1. Anxiety Stable and well  controlled by use of the Celexa 20 mg qd. Rarely has to use the Lorazepam 0.5 mg for acute anxiety. Sleeping well and no significant depressive symptoms now. Recheck in 3-6 months.  2. Essential hypertension Elevated BP. Ready to restart Lisinopril 5 mg qd. No syncope or dizziness. Labs done by Dr. Tedd Hendricks (endocrinologist) on 10-11-17 showed good renal function. Recheck BP in 1 month. - lisinopril (PRINIVIL,ZESTRIL) 5 MG tablet; Take 1 tablet (5 mg total) by mouth daily.  Dispense: 90 tablet; Refill: 3  3. Diabetes mellitus without complication (HCC) Slowly improving with use of Pioglitazone and followed regularly by Dr. Tedd Hendricks (endocrinologist). Normal foot exam today with one small corn on the lateral side of the right 4th toe. Recommended padding to relieve pressure irritation.  4. Breast cancer screening - MM Digital Screening  5. Screening for HIV (human immunodeficiency virus) - HIV antibody       Michelle Kernennis Holland Kotter, PA  [facility]

## 2017-11-06 LAB — HM DIABETES EYE EXAM

## 2018-01-04 DIAGNOSIS — L03032 Cellulitis of left toe: Secondary | ICD-10-CM | POA: Diagnosis not present

## 2018-01-10 DIAGNOSIS — Z20828 Contact with and (suspected) exposure to other viral communicable diseases: Secondary | ICD-10-CM | POA: Diagnosis not present

## 2018-01-10 DIAGNOSIS — R509 Fever, unspecified: Secondary | ICD-10-CM | POA: Diagnosis not present

## 2018-01-10 DIAGNOSIS — R112 Nausea with vomiting, unspecified: Secondary | ICD-10-CM | POA: Diagnosis not present

## 2018-02-28 DIAGNOSIS — E119 Type 2 diabetes mellitus without complications: Secondary | ICD-10-CM | POA: Diagnosis not present

## 2018-02-28 DIAGNOSIS — E781 Pure hyperglyceridemia: Secondary | ICD-10-CM | POA: Diagnosis not present

## 2018-03-07 DIAGNOSIS — E1165 Type 2 diabetes mellitus with hyperglycemia: Secondary | ICD-10-CM | POA: Diagnosis not present

## 2018-03-07 DIAGNOSIS — F17200 Nicotine dependence, unspecified, uncomplicated: Secondary | ICD-10-CM | POA: Diagnosis not present

## 2018-03-07 DIAGNOSIS — E781 Pure hyperglyceridemia: Secondary | ICD-10-CM | POA: Diagnosis not present

## 2018-03-11 ENCOUNTER — Other Ambulatory Visit: Payer: Self-pay | Admitting: Family Medicine

## 2018-05-24 IMAGING — MR MR LUMBAR SPINE W/O CM
5 series · 38 of 48 positions shown · non-contrast
Comparison: None.

CLINICAL DATA: Lumbar radiculitis.

EXAM:
MRI LUMBAR SPINE WITHOUT CONTRAST
TECHNIQUE: Multiplanar, multisequence MR imaging of the lumbar spine was
performed. No intravenous contrast was administered.

[Series 3: T2 · sagittal · 4.0mm · 0.81mm/px · 6 of 15 slices shown (1 of 2)]
[im 1/15]
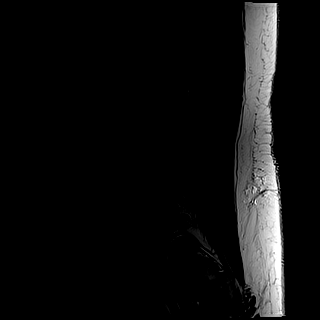
[im 3/15]
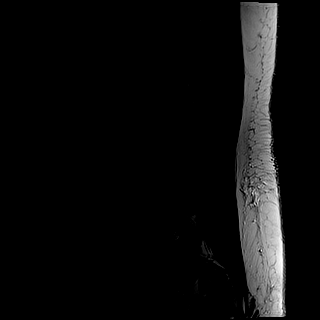
[im 6/15]
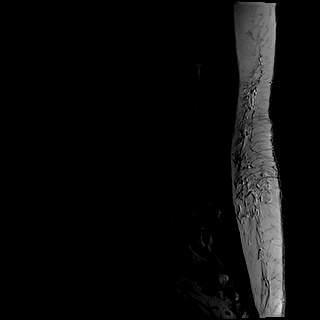
[im 9/15]
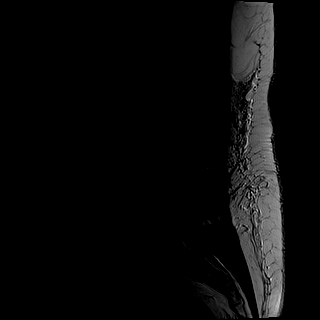
[im 12/15]
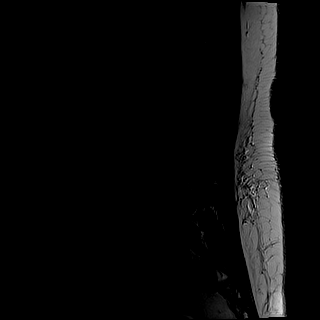
[im 15/15]
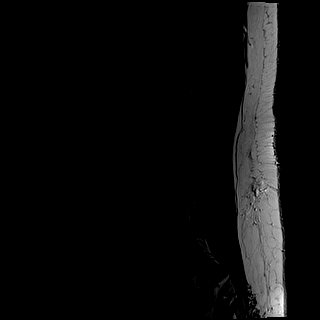

[Series 4: T1 · sagittal · 4.0mm · 0.81mm/px · 6 of 15 slices shown (1 of 2)]
[im 1/15]
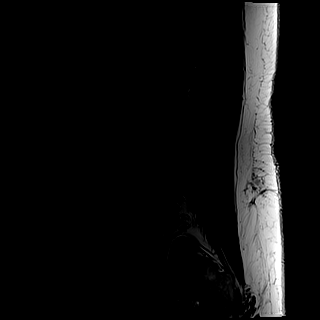
[im 3/15]
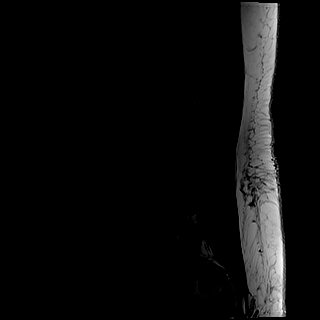
[im 6/15]
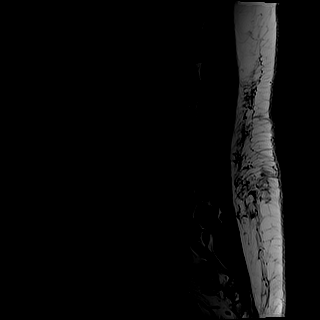
[im 9/15]
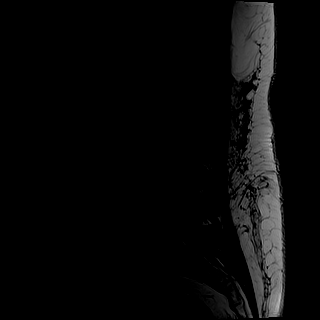
[im 12/15]
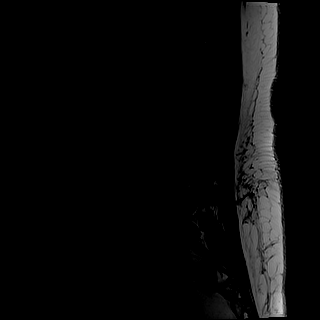
[im 15/15]
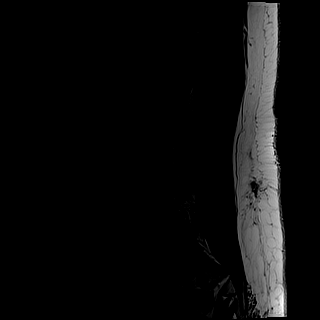

[Series 5: STIR · sagittal · 4.0mm · 1.02mm/px · 6 of 15 slices shown]
[im 1/15]
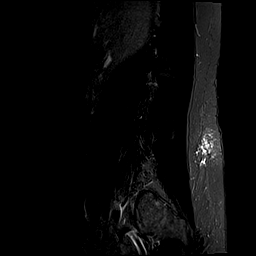
[im 3/15]
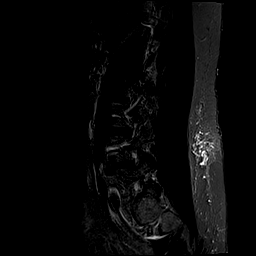
[im 6/15]
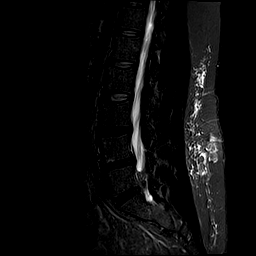
[im 9/15]
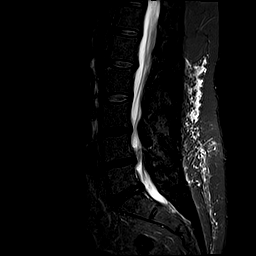
[im 12/15]
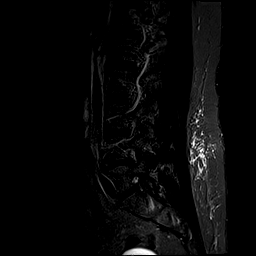
[im 15/15]
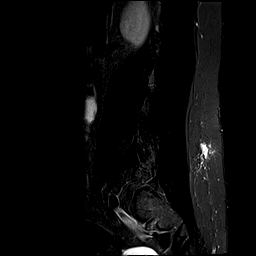

[Series 6: T2 · axial · 4.0mm · 0.78mm/px · z∈[-31,+178]mm · 11 of 38 slices shown (2 of 2)]
[im 1/38]
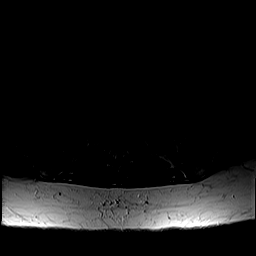
[im 3/38]
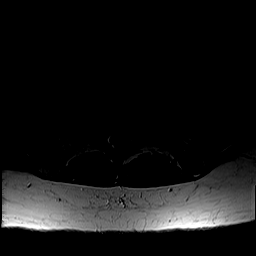
[im 6/38]
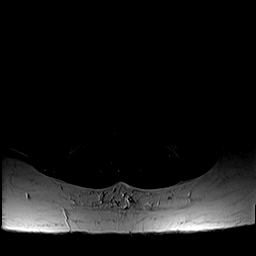
[im 8/38]
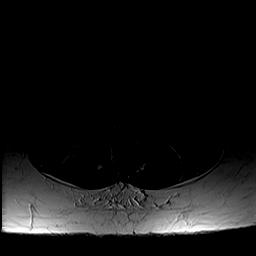
[im 11/38]
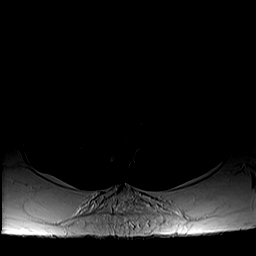
[im 16/38]
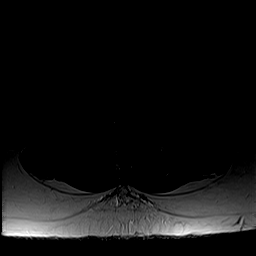
[im 19/38]
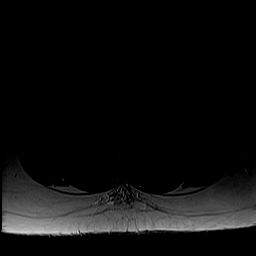
[im 22/38]
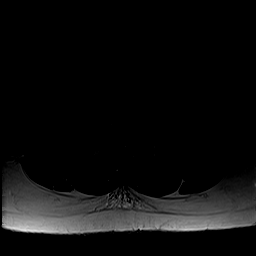
[im 27/38]
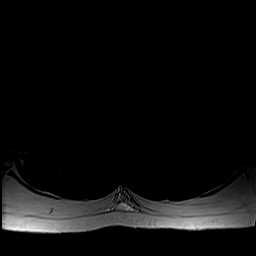
[im 32/38]
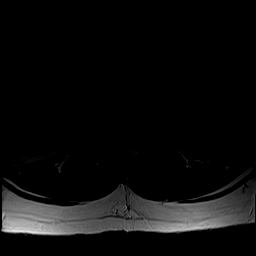
[im 38/38]
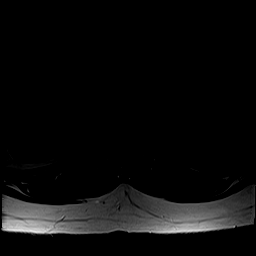

[Series 7: T1 · axial · 4.0mm · 0.39mm/px · z∈[-31,+178]mm · 9 of 38 slices shown (2 of 2)]
[im 1/38]
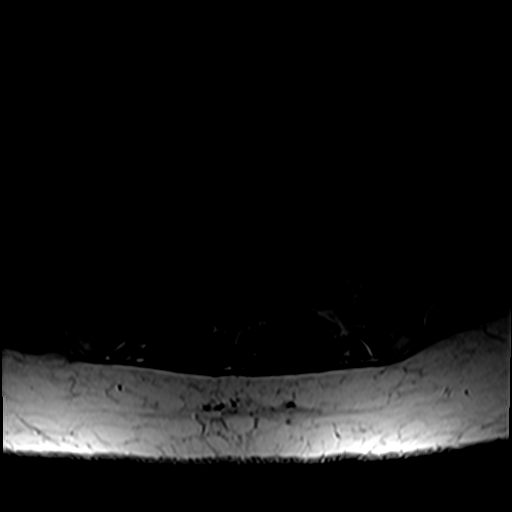
[im 6/38]
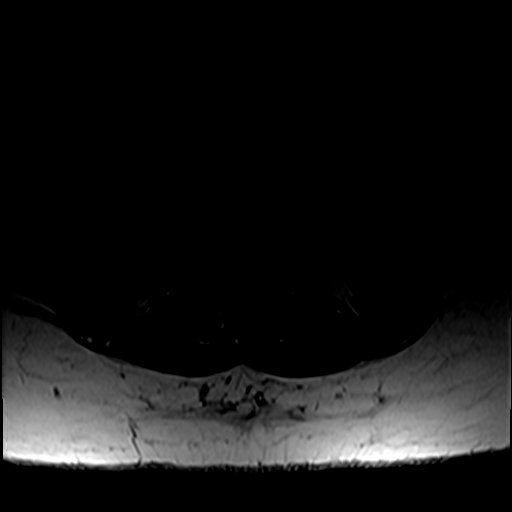
[im 11/38]
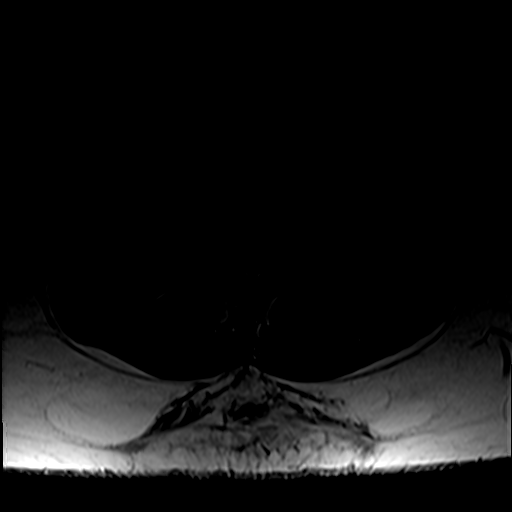
[im 16/38]
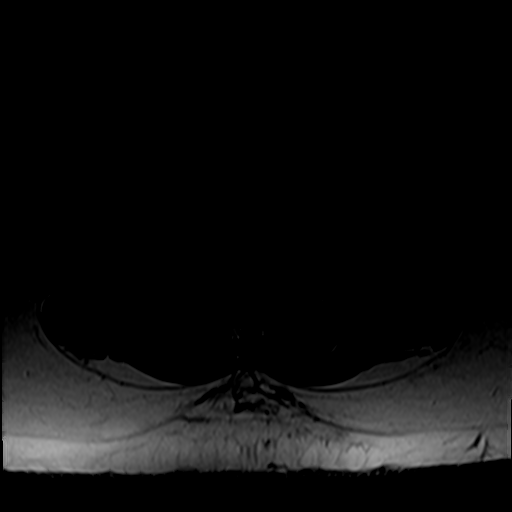
[im 19/38]
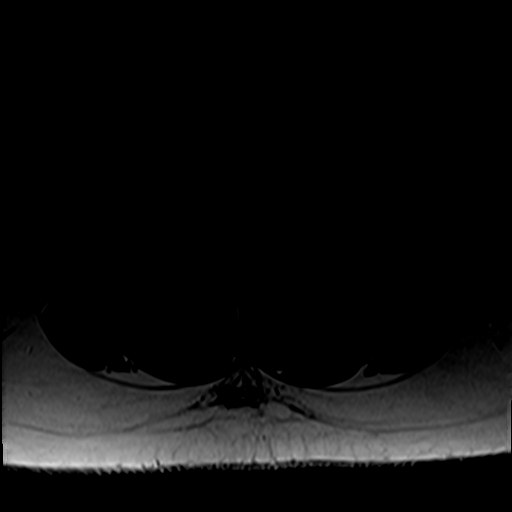
[im 22/38]
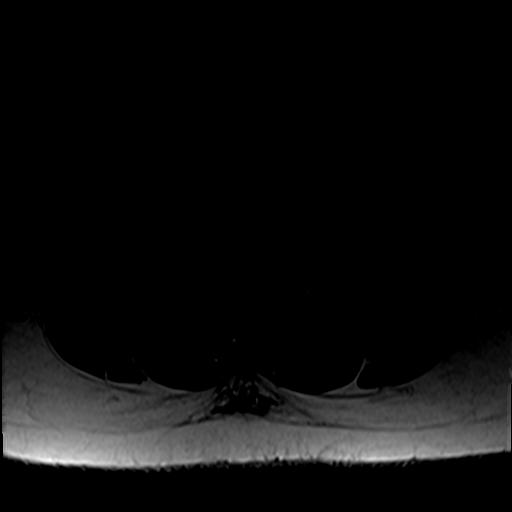
[im 27/38]
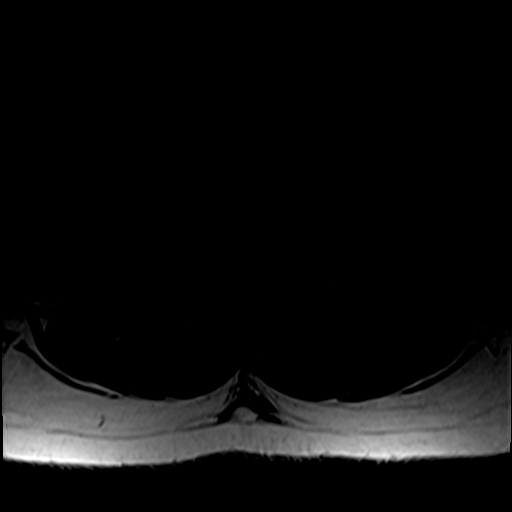
[im 32/38]
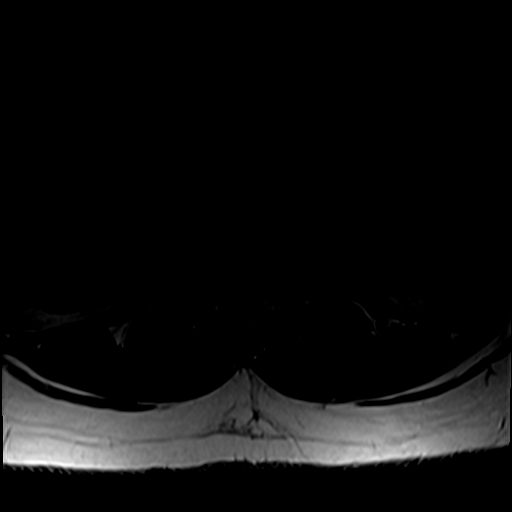
[im 38/38]
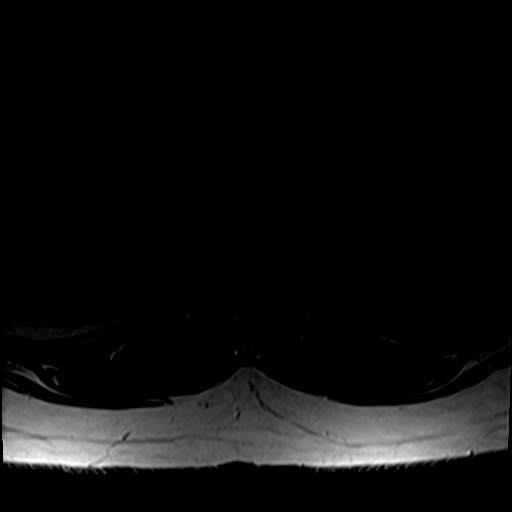

[38 of 48 positions shown; findings below may reference images not displayed]

FINDINGS: Segmentation:  Normal

Alignment:  Normal

Vertebrae:  Normal

Conus medullaris: Extends to the L1-2 level and appears normal.

Paraspinal and other soft tissues: Negative

Disc levels:

L1-2: Mild disc degeneration. Small right-sided disc protrusion
without stenosis

L2-3:  Negative

L3-4: Mild disc bulging. Small extraforaminal disc protrusion on the
left. Bilateral facet hypertrophy.

L4-5: Diffuse bulging of the disc. Shallow extraforaminal disc
protrusion on the right with associated spurring. Mild right
foraminal narrowing. Bilateral facet hypertrophy. Spinal canal
adequate in size.

L5-S1: Small right paracentral disc protrusion extending into the
foramen and lateral to the foramen . Mild right foraminal narrowing.
Mild posterior displacement right S1 nerve root without compression.
IMPRESSION: Small extraforaminal disc protrusion on the left at L3-4

Small extraforaminal disc protrusion on the right at L4-5 with mild
right foraminal narrowing

Small right paracentral and right foraminal and extraforaminal disc
protrusion at L5-S1. Mild right foraminal narrowing. Mild posterior
displacement of the right S1 nerve root without compression.

## 2018-08-11 ENCOUNTER — Other Ambulatory Visit: Payer: Self-pay | Admitting: Family Medicine

## 2018-08-16 DIAGNOSIS — H6982 Other specified disorders of Eustachian tube, left ear: Secondary | ICD-10-CM | POA: Diagnosis not present

## 2018-08-16 DIAGNOSIS — N9089 Other specified noninflammatory disorders of vulva and perineum: Secondary | ICD-10-CM | POA: Diagnosis not present

## 2018-08-16 DIAGNOSIS — R300 Dysuria: Secondary | ICD-10-CM | POA: Diagnosis not present

## 2018-08-16 DIAGNOSIS — B3789 Other sites of candidiasis: Secondary | ICD-10-CM | POA: Diagnosis not present

## 2018-08-16 DIAGNOSIS — H9202 Otalgia, left ear: Secondary | ICD-10-CM | POA: Diagnosis not present

## 2018-09-09 DIAGNOSIS — J019 Acute sinusitis, unspecified: Secondary | ICD-10-CM | POA: Diagnosis not present

## 2018-09-09 DIAGNOSIS — R05 Cough: Secondary | ICD-10-CM | POA: Diagnosis not present

## 2018-09-09 DIAGNOSIS — H6692 Otitis media, unspecified, left ear: Secondary | ICD-10-CM | POA: Diagnosis not present

## 2018-10-03 ENCOUNTER — Telehealth: Payer: Self-pay | Admitting: Family Medicine

## 2018-10-03 NOTE — Telephone Encounter (Signed)
Please review

## 2018-10-03 NOTE — Telephone Encounter (Signed)
Authorized 3 refills of the Celexa on 08-11-18 at the [facility] If she has picked up all three refills, it should last till 11-10-18. Needs follow up appointment in the next 4 weeks since we haven't seen her in a year.

## 2018-10-03 NOTE — Telephone Encounter (Signed)
Pt needing a refill on: citalopram (CELEXA) 20 MG tablet  Please fill at:  [id number] [place], [place] - [address] [phone] (Phone) [phone] (Fax)     Thanks, TGH

## 2018-10-03 NOTE — Telephone Encounter (Signed)
Pt advised about med refills and pt didn't realize that they were sent to pharmacy.  She will check with pharmacy and let us know if any issues.  Pt was scheduled for a f-up appt on Dec 2019

## 2018-11-03 ENCOUNTER — Ambulatory Visit: Payer: BLUE CROSS/BLUE SHIELD | Admitting: Family Medicine

## 2018-11-03 ENCOUNTER — Encounter: Payer: Self-pay | Admitting: Family Medicine

## 2018-11-03 VITALS — BP 114/75 | HR 85 | Temp 98.2°F | Resp 16 | Ht 61.0 in | Wt 176.0 lb

## 2018-11-03 DIAGNOSIS — Z6833 Body mass index (BMI) 33.0-33.9, adult: Secondary | ICD-10-CM

## 2018-11-03 DIAGNOSIS — E119 Type 2 diabetes mellitus without complications: Secondary | ICD-10-CM

## 2018-11-03 DIAGNOSIS — F419 Anxiety disorder, unspecified: Secondary | ICD-10-CM | POA: Diagnosis not present

## 2018-11-03 DIAGNOSIS — F329 Major depressive disorder, single episode, unspecified: Secondary | ICD-10-CM

## 2018-11-03 DIAGNOSIS — I10 Essential (primary) hypertension: Secondary | ICD-10-CM

## 2018-11-03 MED ORDER — LORAZEPAM 0.5 MG PO TABS: 0.5000 mg | ORAL_TABLET | Freq: Three times a day (TID) | ORAL | 1 refills | Status: DC | PRN

## 2018-11-03 NOTE — Progress Notes (Signed)
Patient: Michelle Hendricks Female    DOB: [birth year]   52 y.o.   MRN: [id number] Visit Date: 11/03/2018  Today's Provider: Dortha Kernennis , PA   Chief Complaint  Patient presents with  . Follow-up  . Hypertension  . Anxiety   Subjective:    HPI   Hypertension, follow-up:  BP Readings from Last 3 Encounters:  11/03/18 114/75  10/31/17 (!) 156/96  05/27/17 (!) 142/98   She was last seen for hypertension 1 years ago.  BP at that visit was 156/96. Management since that visit includes; restarted lisinopril 5 mg.She reports good compliance with treatment. She is not having side effects. none She is not exercising. She is not adherent to low salt diet.   Outside blood pressures are normal. She is experiencing none.  Patient denies none.   Cardiovascular risk factors include diabetes mellitus.  Use of agents associated with hypertension: none.   ---------------------------------------------------------------  Anxiety From 10/31/2017-no changes. Citalopram helps with irritability and difficulty concentrating. Wake up couple times a night with hot sensation. No tremors but some situational reaction of anxiety. Eating 2 meals a day without weight gain. Out of the Lorazepam that helped with irritability or near tears. No daytime sleepiness  Past Medical History:  Diagnosis Date  . Anxiety   . Depression   . Diabetes mellitus without complication (HCC)   . Hypertension    Past Surgical History:  Procedure Laterality Date  . NASAL SINUS SURGERY     Family History  Problem Relation Age of Onset  . Alzheimer's disease Maternal Grandmother   . Cancer Maternal Grandfather   . Diabetes Maternal Grandfather   . Heart attack Father    Allergies  Allergen Reactions  . Penicillins Rash and Other (See Comments)    Has patient had a PCN reaction causing immediate rash, facial/tongue/throat swelling, SOB or lightheadedness with hypotension: No Has patient had a PCN reaction  causing severe rash involving mucus membranes or skin necrosis: No Has patient had a PCN reaction that required hospitalization No Has patient had a PCN reaction occurring within the last 10 years: No If all of the above answers are "NO", then may proceed with Cephalosporin use.    Current Outpatient Medications:  .  citalopram (CELEXA) 20 MG tablet, TAKE 1 TABLET BY MOUTH EVERYDAY AT BEDTIME, Disp: 30 tablet, Rfl: 3 .  fenofibrate (TRICOR) 145 MG tablet, Take 145 mg by mouth daily., Disp: , Rfl: 11 .  lisinopril (PRINIVIL,ZESTRIL) 5 MG tablet, Take 1 tablet (5 mg total) by mouth daily., Disp: 90 tablet, Rfl: 3 .  LORazepam (ATIVAN) 0.5 MG tablet, Take 1 tablet (0.5 mg total) by mouth 3 (three) times daily as needed for anxiety., Disp: 30 tablet, Rfl: 1 .  pioglitazone (ACTOS) 15 MG tablet, Take 15 mg by mouth daily., Disp: , Rfl:   Review of Systems  Constitutional: Negative for appetite change, chills, fatigue and fever.  Respiratory: Negative for chest tightness and shortness of breath.   Cardiovascular: Negative for chest pain and palpitations.  Gastrointestinal: Negative for abdominal pain, nausea and vomiting.  Neurological: Negative for dizziness and weakness.   Social History   Tobacco Use  . Smoking status: Current Some Day Smoker    Packs/day: 0.50    Types: Cigarettes  . Smokeless tobacco: Never Used  Substance Use Topics  . Alcohol use: No    Alcohol/week: 0.0 standard drinks   Objective:   BP 114/75 (BP Location: Right Arm, Patient Position:  Sitting, Cuff Size: Large)   Pulse 85   Temp 98.2 F (36.8 C) (Oral)   Resp 16   Ht 5\' 1"  (1.549 m)   Wt 176 lb (79.8 kg)   LMP 04/16/2015   SpO2 98%   BMI 33.25 kg/m   Wt Readings from Last 3 Encounters:  11/03/18 176 lb (79.8 kg)  10/31/17 182 lb 9.6 oz (82.8 kg)  05/27/17 186 lb 12.8 oz (84.7 kg)   Vitals:   11/03/18 1550  BP: 114/75  Pulse: 85  Resp: 16  Temp: 98.2 F (36.8 C)  TempSrc: Oral  SpO2: 98%    Weight: 176 lb (79.8 kg)  Height: 5\' 1"  (1.549 m)   Physical Exam  Constitutional: She is oriented to person, place, and time. She appears well-developed and well-nourished. No distress.  HENT:  Head: Normocephalic and atraumatic.  Right Ear: Hearing normal.  Left Ear: Hearing normal.  Nose: Nose normal.  Eyes: Conjunctivae and lids are normal. Right eye exhibits no discharge. Left eye exhibits no discharge. No scleral icterus.  Neck: Neck supple.  Cardiovascular: Normal rate and regular rhythm.  Pulmonary/Chest: Effort normal and breath sounds normal. No respiratory distress.  Abdominal: Soft. Bowel sounds are normal.  Musculoskeletal: Normal range of motion.  Neurological: She is alert and oriented to person, place, and time.  Skin: Skin is intact. No lesion and no rash noted.  Psychiatric: Her speech is normal. Thought content normal. Her mood appears anxious. She is agitated. She exhibits a depressed mood.   Depression screen [facility] 2/9 11/03/2018 05/27/2017  Decreased Interest 1 1  Down, Depressed, Hopeless 1 1  PHQ - 2 Score 2 2  Altered sleeping 2 2  Tired, decreased energy 2 2  Change in appetite 2 2  Feeling bad or failure about yourself  1 0  Trouble concentrating 3 3  Moving slowly or fidgety/restless 0 0  Suicidal thoughts 0 0  PHQ-9 Score 12 11  Difficult doing work/chores Very difficult Very difficult       Assessment & Plan:     1. Anxiety and depression Extra stress in new job. Citalopram helps but does not completely control anxiety reaction to stress. Will continue Citalopram 20 mg hs and add Lorazepam for prn use only. Schedule referral to psychiatry at patient's request and get routine follow up labs. - CBC with Differential/Platelet - Comprehensive metabolic panel - TSH - LORazepam (ATIVAN) 0.5 MG tablet; Take 1 tablet (0.5 mg total) by mouth 3 (three) times daily as needed for anxiety.  Dispense: 30 tablet; Refill: 1  2. Essential hypertension Well  controlled with use of Lisinopril 5 mg qd. Continue to restrict salt intake and caffeine. Recheck routine labs and follow up pending reports. - CBC with Differential/Platelet - Comprehensive metabolic panel - TSH - Lipid panel  3. Diabetes mellitus without complication (HCC) Had what sounds like nausea with her first trial of Trulicity in April 2019. No longer taking any Metformin due to diarrhea episodes and did not refill the Actos 15 mg qd. Has not had any follow up with Dr. Tedd Sias (endocrinologist) after the Trulicity side effect of nausea. Denies polyuria, polydipsia, peripheral neuropathy or vision changes. Will get follow up labs and consider return to care of Dr. Tedd Sias. Has annual eye exam with use of contacts and normal detailed foot exam today. - CBC with Differential/Platelet - Comprehensive metabolic panel - Hemoglobin A1c - Lipid panel  4. BMI 33.0-33.9,adult Has lost 6 lbs in the past year. Will  recheck labs for metabolic imbalances and control progress of diabetes. - CBC with Differential/Platelet - Comprehensive metabolic panel - TSH - Hemoglobin A1c - Lipid panel       Dortha Kern, PA  [facility]

## 2018-11-13 ENCOUNTER — Other Ambulatory Visit: Payer: Self-pay | Admitting: Family Medicine

## 2018-11-13 DIAGNOSIS — Z6833 Body mass index (BMI) 33.0-33.9, adult: Secondary | ICD-10-CM | POA: Diagnosis not present

## 2018-11-13 DIAGNOSIS — I10 Essential (primary) hypertension: Secondary | ICD-10-CM

## 2018-11-13 DIAGNOSIS — F419 Anxiety disorder, unspecified: Secondary | ICD-10-CM | POA: Diagnosis not present

## 2018-11-13 DIAGNOSIS — E119 Type 2 diabetes mellitus without complications: Secondary | ICD-10-CM | POA: Diagnosis not present

## 2018-11-13 MED ORDER — LISINOPRIL 5 MG PO TABS: 5.0000 mg | ORAL_TABLET | Freq: Every day | ORAL | 3 refills | Status: DC

## 2018-11-13 NOTE — Telephone Encounter (Signed)
[facility] [address], faxed refill request for the following medications:  lisinopril (PRINIVIL,ZESTRIL) 5 MG tablet  Last Rx: 10/31/17 Please advise. Thanks TNP

## 2018-11-14 LAB — COMPREHENSIVE METABOLIC PANEL
ALBUMIN: 4.2 g/dL (ref 3.5–5.5)
ALT: 17 IU/L (ref 0–32)
AST: 14 IU/L (ref 0–40)
Albumin/Globulin Ratio: 1.4 (ref 1.2–2.2)
Alkaline Phosphatase: 117 IU/L (ref 39–117)
BILIRUBIN TOTAL: 0.2 mg/dL (ref 0.0–1.2)
BUN / CREAT RATIO: 17 (ref 9–23)
BUN: 12 mg/dL (ref 6–24)
CHLORIDE: 96 mmol/L (ref 96–106)
CO2: 22 mmol/L (ref 20–29)
Calcium: 9.1 mg/dL (ref 8.7–10.2)
Creatinine, Ser: 0.69 mg/dL (ref 0.57–1.00)
GFR calc non Af Amer: 102 mL/min/{1.73_m2} (ref >59)
GFR, EST AFRICAN AMERICAN: 117 mL/min/{1.73_m2} (ref >59)
GLUCOSE: 214 mg/dL — AB (ref 65–99)
Globulin, Total: 3 g/dL (ref 1.5–4.5)
POTASSIUM: 3.9 mmol/L (ref 3.5–5.2)
Sodium: 136 mmol/L (ref 134–144)
Total Protein: 7.2 g/dL (ref 6.0–8.5)

## 2018-11-14 LAB — CBC WITH DIFFERENTIAL/PLATELET
BASOS ABS: 0 10*3/uL (ref 0.0–0.2)
BASOS: 1 %
EOS (ABSOLUTE): 0.1 10*3/uL (ref 0.0–0.4)
EOS: 1 %
HEMATOCRIT: 43 % (ref 34.0–46.6)
Hemoglobin: 15 g/dL (ref 11.1–15.9)
IMMATURE GRANS (ABS): 0 10*3/uL (ref 0.0–0.1)
IMMATURE GRANULOCYTES: 0 %
LYMPHS ABS: 2 10*3/uL (ref 0.7–3.1)
LYMPHS: 27 %
MCH: 29.9 pg (ref 26.6–33.0)
MCHC: 34.9 g/dL (ref 31.5–35.7)
MCV: 86 fL (ref 79–97)
MONOS ABS: 0.5 10*3/uL (ref 0.1–0.9)
Monocytes: 6 %
NEUTROS PCT: 65 %
Neutrophils Absolute: 4.9 10*3/uL (ref 1.4–7.0)
PLATELETS: 294 10*3/uL (ref 150–450)
RBC: 5.01 x10E6/uL (ref 3.77–5.28)
RDW: 12.5 % (ref 12.3–15.4)
WBC: 7.5 10*3/uL (ref 3.4–10.8)

## 2018-11-14 LAB — HEMOGLOBIN A1C
Est. average glucose Bld gHb Est-mCnc: 249 mg/dL
Hgb A1c MFr Bld: 10.3 % — ABNORMAL HIGH (ref 4.8–5.6)

## 2018-11-14 LAB — LIPID PANEL
Chol/HDL Ratio: 9.8 ratio — ABNORMAL HIGH (ref 0.0–4.4)
Cholesterol, Total: 255 mg/dL — ABNORMAL HIGH (ref 100–199)
HDL: 26 mg/dL — AB (ref >39)
Triglycerides: 1328 mg/dL (ref 0–149)

## 2018-11-14 LAB — TSH: TSH: 2.69 u[IU]/mL (ref 0.450–4.500)

## 2018-11-24 ENCOUNTER — Other Ambulatory Visit: Payer: Self-pay | Admitting: Family Medicine

## 2018-12-21 ENCOUNTER — Other Ambulatory Visit: Payer: Self-pay | Admitting: Family Medicine

## 2019-08-07 ENCOUNTER — Telehealth: Payer: Self-pay

## 2019-08-07 DIAGNOSIS — R300 Dysuria: Secondary | ICD-10-CM | POA: Diagnosis not present

## 2019-08-07 DIAGNOSIS — N760 Acute vaginitis: Secondary | ICD-10-CM | POA: Diagnosis not present

## 2019-08-07 NOTE — Telephone Encounter (Signed)
Patient states that she took a Diflucan that was prescribed by her eye doctor because she had a yeast infection after completing eye drop antibiotics. Patient states her vaginal area is swollen, red, and she is having difficulty walking. I advised her to go to an urgent care or ER for further evaluation.

## 2019-08-07 NOTE — Telephone Encounter (Signed)
Agreed -

## 2019-08-09 ENCOUNTER — Other Ambulatory Visit: Payer: Self-pay | Admitting: Family Medicine

## 2019-11-20 ENCOUNTER — Other Ambulatory Visit: Payer: Self-pay | Admitting: Family Medicine

## 2019-11-20 DIAGNOSIS — I10 Essential (primary) hypertension: Secondary | ICD-10-CM

## 2019-11-20 MED ORDER — CITALOPRAM HYDROBROMIDE 20 MG PO TABS: ORAL_TABLET | ORAL | 0 refills | Status: DC

## 2019-11-20 MED ORDER — LISINOPRIL 5 MG PO TABS: 5.0000 mg | ORAL_TABLET | Freq: Every day | ORAL | 3 refills | Status: DC

## 2019-11-20 NOTE — Telephone Encounter (Signed)
Requested medication (s) are due for refill today: yes  Requested medication (s) are on the active medication list: yes  Last refill:  11/13/2018  Future visit scheduled: yes  Notes to clinic:  Patient has appointment on 12/07/2019 and would like to have enough medication filled until then   Requested Prescriptions  Pending Prescriptions Disp Refills   citalopram (CELEXA) 20 MG tablet 90 tablet 0    Sig: TAKE 1 TABLET BY MOUTH EVERYDAY AT BEDTIME      Psychiatry:  Antidepressants - SSRI Failed - 11/20/2019  2:12 PM      Failed - Completed PHQ-2 or PHQ-9 in the last 360 days.      Failed - Valid encounter within last 6 months    Recent Outpatient Visits           1 year ago Anxiety and depression   [place], Vickki Muff, [place]   2 years ago [place], Vickki Muff, [place]   2 years ago [place], [place]   3 years ago Body aches   [place], [place]   3 years ago Chronic fatigue   [place], [place]       Future Appointments             In 2 weeks [place], Vickki Muff, [place], [facility]              lisinopril (ZESTRIL) 5 MG tablet 90 tablet 3    Sig: Take 1 tablet (5 mg total) by mouth daily.      Cardiovascular:  ACE Inhibitors Failed - 11/20/2019  2:12 PM      Failed - Cr in normal range and within 180 days    Creatinine, Ser  Date Value Ref Range Status  11/13/2018 0.69 0.57 - 1.00 mg/dL Final          Failed - K in normal range and within 180 days    Potassium  Date Value Ref Range Status  11/13/2018 3.9 3.5 - 5.2 mmol/L Final          Failed - Valid encounter within last 6 months    Recent Outpatient Visits           1 year ago Anxiety and depression   [place], Vickki Muff, [place]   2 years ago [place], Vickki Muff, [place]   2 years ago [place], [place]   3 years ago Body aches   [employer], [place], [place]   3 years ago Chronic fatigue   [employer], Vickki Muff, [place]       Future Appointments             In 2 weeks [place], Vickki Muff, [place], [place] - Patient is not pregnant      Passed - Last BP in normal range    BP Readings from Last 1 Encounters:  11/03/18 114/75

## 2019-11-20 NOTE — Telephone Encounter (Signed)
Pt needs a refill on lisinopril 5 mg.  and citalopram 20 mg . Pt has and appt on 12-07-[facility] [address] in [place]

## 2019-12-07 ENCOUNTER — Ambulatory Visit: Payer: BLUE CROSS/BLUE SHIELD | Admitting: Family Medicine

## 2019-12-15 ENCOUNTER — Encounter: Payer: Self-pay | Admitting: Physician Assistant

## 2019-12-15 ENCOUNTER — Other Ambulatory Visit: Payer: Self-pay

## 2019-12-15 ENCOUNTER — Ambulatory Visit (INDEPENDENT_AMBULATORY_CARE_PROVIDER_SITE_OTHER): Payer: BC Managed Care – PPO | Admitting: Physician Assistant

## 2019-12-15 VITALS — BP 138/91 | HR 99 | Temp 96.8°F | Wt 171.0 lb

## 2019-12-15 DIAGNOSIS — F329 Major depressive disorder, single episode, unspecified: Secondary | ICD-10-CM

## 2019-12-15 DIAGNOSIS — Z91199 Patient's noncompliance with other medical treatment and regimen due to unspecified reason: Secondary | ICD-10-CM

## 2019-12-15 DIAGNOSIS — I10 Essential (primary) hypertension: Secondary | ICD-10-CM | POA: Diagnosis not present

## 2019-12-15 DIAGNOSIS — Z1211 Encounter for screening for malignant neoplasm of colon: Secondary | ICD-10-CM | POA: Diagnosis not present

## 2019-12-15 DIAGNOSIS — F419 Anxiety disorder, unspecified: Secondary | ICD-10-CM | POA: Diagnosis not present

## 2019-12-15 DIAGNOSIS — Z9119 Patient's noncompliance with other medical treatment and regimen: Secondary | ICD-10-CM

## 2019-12-15 DIAGNOSIS — E119 Type 2 diabetes mellitus without complications: Secondary | ICD-10-CM | POA: Diagnosis not present

## 2019-12-15 DIAGNOSIS — Z1231 Encounter for screening mammogram for malignant neoplasm of breast: Secondary | ICD-10-CM

## 2019-12-15 DIAGNOSIS — F32A Depression, unspecified: Secondary | ICD-10-CM

## 2019-12-15 MED ORDER — LISINOPRIL 10 MG PO TABS: 10.0000 mg | ORAL_TABLET | Freq: Every day | ORAL | 3 refills | Status: DC

## 2019-12-15 MED ORDER — LORAZEPAM 0.5 MG PO TABS: 0.5000 mg | ORAL_TABLET | Freq: Two times a day (BID) | ORAL | 0 refills | Status: DC

## 2019-12-15 MED ORDER — CITALOPRAM HYDROBROMIDE 20 MG PO TABS: ORAL_TABLET | ORAL | 0 refills | Status: DC

## 2019-12-15 NOTE — Patient Instructions (Addendum)
[facility] - free and low cost clinic, please look into establishing with these people if you are unable to get insurance in the next few months.    Diabetes Mellitus and Exercise Exercising regularly is important for your overall health, especially when you have diabetes (diabetes mellitus). Exercising is not only about losing weight. It has many other health benefits, such as increasing muscle strength and bone density and reducing body fat and stress. This leads to improved fitness, flexibility, and endurance, all of which result in better overall health. Exercise has additional benefits for people with diabetes, including:  Reducing appetite.  Helping to lower and control blood glucose.  Lowering blood pressure.  Helping to control amounts of fatty substances (lipids) in the blood, such as cholesterol and triglycerides.  Helping the body to respond better to insulin (improving insulin sensitivity).  Reducing how much insulin the body needs.  Decreasing the risk for heart disease by: ? Lowering cholesterol and triglyceride levels. ? Increasing the levels of good cholesterol. ? Lowering blood glucose levels. What is my activity plan? Your health care provider or certified diabetes educator can help you make a plan for the type and frequency of exercise (activity plan) that works for you. Make sure that you:  Do at least 150 minutes of moderate-intensity or vigorous-intensity exercise each week. This could be brisk walking, biking, or water aerobics. ? Do stretching and strength exercises, such as yoga or weightlifting, at least 2 times a week. ? Spread out your activity over at least 3 days of the week.  Get some form of physical activity every day. ? Do not go more than 2 days in a row without some kind of physical activity. ? Avoid being inactive for more than 30 minutes at a time. Take frequent breaks to walk or  stretch.  Choose a type of exercise or activity that you enjoy, and set realistic goals.  Start slowly, and gradually increase the intensity of your exercise over time. What do I need to know about managing my diabetes?   Check your blood glucose before and after exercising. ? If your blood glucose is 240 mg/dL (13.3 mmol/L) or higher before you exercise, check your urine for ketones. If you have ketones in your urine, do not exercise until your blood glucose returns to normal. ? If your blood glucose is 100 mg/dL (5.6 mmol/L) or lower, eat a snack containing 15-20 grams of carbohydrate. Check your blood glucose 15 minutes after the snack to make sure that your level is above 100 mg/dL (5.6 mmol/L) before you start your exercise.  Know the symptoms of low blood glucose (hypoglycemia) and how to treat it. Your risk for hypoglycemia increases during and after exercise. Common symptoms of hypoglycemia can include: ? Hunger. ? Anxiety. ? Sweating and feeling clammy. ? Confusion. ? Dizziness or feeling light-headed. ? Increased heart rate or palpitations. ? Blurry vision. ? Tingling or numbness around the mouth, lips, or tongue. ? Tremors or shakes. ? Irritability.  Keep a rapid-acting carbohydrate snack available before, during, and after exercise to help prevent or treat hypoglycemia.  Avoid injecting insulin into areas of the body that are going to be exercised. For example, avoid injecting insulin into: ? The arms, when playing tennis. ? The legs, when jogging.  Keep records of your exercise habits. Doing this can help you and your health care provider adjust your diabetes management plan as needed. Write down: ? Food that you  eat before and after you exercise. ? Blood glucose levels before and after you exercise. ? The type and amount of exercise you have done. ? When your insulin is expected to peak, if you use insulin. Avoid exercising at times when your insulin is  peaking.  When you start a new exercise or activity, work with your health care provider to make sure the activity is safe for you, and to adjust your insulin, medicines, or food intake as needed.  Drink plenty of water while you exercise to prevent dehydration or heat stroke. Drink enough fluid to keep your urine clear or pale yellow. Summary  Exercising regularly is important for your overall health, especially when you have diabetes (diabetes mellitus).  Exercising has many health benefits, such as increasing muscle strength and bone density and reducing body fat and stress.  Your health care provider or certified diabetes educator can help you make a plan for the type and frequency of exercise (activity plan) that works for you.  When you start a new exercise or activity, work with your health care provider to make sure the activity is safe for you, and to adjust your insulin, medicines, or food intake as needed. This information is not intended to replace advice given to you by your health care provider. Make sure you discuss any questions you have with your health care provider. Document Revised: 06/13/2017 Document Reviewed: 04/30/2016 Elsevier Patient Education  2020 [employer].

## 2019-12-15 NOTE — Progress Notes (Signed)
Patient: Michelle Hendricks Female    DOB: Oct 12, 1966   54 y.o.   MRN: [id number] Visit Date: 12/15/2019  Today's Provider: Trinna Post, PA-C   Chief Complaint  Patient presents with  . Follow-up   Subjective:     HPI   Says she just lost her job in December and is upset about it.   HTN: Checks it at home and gets similar readings. Currently on Lisinopril 5 mg daily.   BP Readings from Last 3 Encounters:  12/15/19 (!) 138/91  11/03/18 114/75  10/31/17 (!) 156/96    Patient here for medication refill.  Colon Cancer Screening: Never.   DM II: Not currently on treatment. Previously saw Dr. Gabriel Carina. She was dissatisfied with the care because she felt she did not get a satisfactory explanation about why her A1c continued to rise despite being on medication. So she stopped taking all medication and reports she is working on diet.   Last seen by endocrinology 03/2018. The POC A1c on our machine today is "too high to read."   Lab Results  Component Value Date   HGBA1C 10.3 (H) 11/13/2018     Wt Readings from Last 3 Encounters:  12/15/19 171 lb (77.6 kg)  11/03/18 176 lb (79.8 kg)  10/31/17 182 lb 9.6 oz (82.8 kg)     Allergies  Allergen Reactions  . Penicillins Rash and Other (See Comments)    Has patient had a PCN reaction causing immediate rash, facial/tongue/throat swelling, SOB or lightheadedness with hypotension: No Has patient had a PCN reaction causing severe rash involving mucus membranes or skin necrosis: No Has patient had a PCN reaction that required hospitalization No Has patient had a PCN reaction occurring within the last 10 years: No If all of the above answers are "NO", then may proceed with Cephalosporin use.     Current Outpatient Medications:  .  citalopram (CELEXA) 20 MG tablet, TAKE 1 TABLET BY MOUTH EVERYDAY AT BEDTIME, Disp: 90 tablet, Rfl: 0 .  LORazepam (ATIVAN) 0.5 MG tablet, Take 1 tablet (0.5 mg total) by mouth 3 (three) times daily as  needed for anxiety., Disp: 30 tablet, Rfl: 1 .  lisinopril (ZESTRIL) 10 MG tablet, Take 1 tablet (10 mg total) by mouth daily., Disp: 90 tablet, Rfl: 3 .  pioglitazone (ACTOS) 15 MG tablet, Take 15 mg by mouth daily., Disp: , Rfl:   Review of Systems  Social History   Tobacco Use  . Smoking status: Current Some Day Smoker    Packs/day: 0.50    Types: Cigarettes  . Smokeless tobacco: Never Used  Substance Use Topics  . Alcohol use: No    Alcohol/week: 0.0 standard drinks      Objective:   BP (!) 138/91 (BP Location: Right Arm, Patient Position: Sitting, Cuff Size: Normal)   Pulse 99   Temp (!) 96.8 F (36 C) (Temporal)   Wt 171 lb (77.6 kg)   LMP 04/16/2015   BMI 32.31 kg/m  Vitals:   12/15/19 1559  BP: (!) 138/91  Pulse: 99  Temp: (!) 96.8 F (36 C)  TempSrc: Temporal  Weight: 171 lb (77.6 kg)  Body mass index is 32.31 kg/m.   Physical Exam Constitutional:      Appearance: Normal appearance. She is obese.  Cardiovascular:     Rate and Rhythm: Normal rate and regular rhythm.     Heart sounds: Normal heart sounds.  Pulmonary:     Effort: Pulmonary effort  is normal.     Breath sounds: Normal breath sounds.  Skin:    General: Skin is warm and dry.  Neurological:     Mental Status: She is alert and oriented to person, place, and time. Mental status is at baseline.  Psychiatric:        Mood and Affect: Mood normal.        Behavior: Behavior normal.      No results found for any visits on 12/15/19.     Assessment & Plan    1. Hypertension, unspecified type  Increase as below.   - lisinopril (ZESTRIL) 10 MG tablet; Take 1 tablet (10 mg total) by mouth daily.  Dispense: 90 tablet; Refill: 3  2. Colon cancer screening  Counseled she is not billed until the test is completed.  - Cologuard  3. Type 2 diabetes   Attempted to take a1c today however it was too high to read on our machine. Our machine typically reads up to ~14% A1c. Counseled patient this  is extremely concerning and life threatening. She is not receptive to any messages about her health today.   I have provided resources for free and low cost clinics if she is not able to secure health insurance. Counseled it is extremely important to follow up with her diabetes. She hasn't been seen in this clinic since 11/2018, prior to her losing her job. There seems to be an element of non-compliance and I have emphasized that her health status is life threatening.   -  Lipid Profile - Comprehensive Metabolic Panel (CMET) - CBC with Differential - TSH  4. Anxiety  Refill lorazepam #30. Beyond this her PCP should refill.  - citalopram (CELEXA) 20 MG tablet; TAKE 1 TABLET BY MOUTH EVERYDAY AT BEDTIME  Dispense: 90 tablet; Refill: 0  5. Depression, unspecified depression type  - citalopram (CELEXA) 20 MG tablet; TAKE 1 TABLET BY MOUTH EVERYDAY AT BEDTIME  Dispense: 90 tablet; Refill: 0  6. Anxiety and depression  - LORazepam (ATIVAN) 0.5 MG tablet; Take 1 tablet (0.5 mg total) by mouth 2 (two) times daily.  Dispense: 30 tablet; Refill: 0  7. Encounter for screening mammogram for malignant neoplasm of breast  - MM Digital Screening; Future  8. Noncompliance with diabetes treatment  The entirety of the information documented in the History of Present Illness, Review of Systems and Physical Exam were personally obtained by me. Portions of this information were initially documented by [facility] and reviewed by me for thoroughness and accuracy.      Trey Sailors, PA-C  [facility]

## 2019-12-16 MED ORDER — LORAZEPAM 0.5 MG PO TABS: 0.5000 mg | ORAL_TABLET | Freq: Two times a day (BID) | ORAL | 0 refills | Status: DC

## 2019-12-28 ENCOUNTER — Ambulatory Visit: Payer: Self-pay | Admitting: Family Medicine

## 2020-02-05 ENCOUNTER — Telehealth: Payer: Self-pay | Admitting: *Deleted

## 2020-02-05 ENCOUNTER — Telehealth: Payer: Self-pay

## 2020-02-05 LAB — CBC WITH DIFFERENTIAL/PLATELET
Basophils Absolute: 0.1 10*3/uL (ref 0.0–0.2)
Basos: 1 %
EOS (ABSOLUTE): 0.1 10*3/uL (ref 0.0–0.4)
Eos: 1 %
Hematocrit: 41.7 % (ref 34.0–46.6)
Hemoglobin: 14.3 g/dL (ref 11.1–15.9)
Immature Grans (Abs): 0 10*3/uL (ref 0.0–0.1)
Immature Granulocytes: 0 %
Lymphocytes Absolute: 3 10*3/uL (ref 0.7–3.1)
Lymphs: 35 %
MCH: 31 pg (ref 26.6–33.0)
MCHC: 34.3 g/dL (ref 31.5–35.7)
MCV: 90 fL (ref 79–97)
Monocytes Absolute: 0.5 10*3/uL (ref 0.1–0.9)
Monocytes: 6 %
Neutrophils Absolute: 4.9 10*3/uL (ref 1.4–7.0)
Neutrophils: 57 %
Platelets: 285 10*3/uL (ref 150–450)
RBC: 4.62 x10E6/uL (ref 3.77–5.28)
RDW: 12.8 % (ref 11.7–15.4)
WBC: 8.6 10*3/uL (ref 3.4–10.8)

## 2020-02-05 LAB — COMPREHENSIVE METABOLIC PANEL
ALT: 23 IU/L (ref 0–32)
AST: 17 IU/L (ref 0–40)
Albumin/Globulin Ratio: 1.8 (ref 1.2–2.2)
Albumin: 4.3 g/dL (ref 3.8–4.9)
Alkaline Phosphatase: 82 IU/L (ref 39–117)
BUN/Creatinine Ratio: 20 (ref 9–23)
BUN: 13 mg/dL (ref 6–24)
Bilirubin Total: <0.2 mg/dL (ref 0.0–1.2)
CO2: 20 mmol/L (ref 20–29)
Calcium: 9 mg/dL (ref 8.7–10.2)
Chloride: 98 mmol/L (ref 96–106)
Creatinine, Ser: 0.64 mg/dL (ref 0.57–1.00)
GFR calc Af Amer: 119 mL/min/{1.73_m2} (ref >59)
GFR calc non Af Amer: 103 mL/min/{1.73_m2} (ref >59)
Globulin, Total: 2.4 g/dL (ref 1.5–4.5)
Glucose: 291 mg/dL — ABNORMAL HIGH (ref 65–99)
Potassium: 4 mmol/L (ref 3.5–5.2)
Sodium: 137 mmol/L (ref 134–144)
Total Protein: 6.7 g/dL (ref 6.0–8.5)

## 2020-02-05 LAB — LIPID PANEL
Chol/HDL Ratio: 11.7 ratio — ABNORMAL HIGH (ref 0.0–4.4)
Cholesterol, Total: 280 mg/dL — ABNORMAL HIGH (ref 100–199)
HDL: 24 mg/dL — ABNORMAL LOW (ref >39)
Triglycerides: 1541 mg/dL (ref 0–149)

## 2020-02-05 LAB — TSH: TSH: 5.05 u[IU]/mL — ABNORMAL HIGH (ref 0.450–4.500)

## 2020-02-05 NOTE — Telephone Encounter (Signed)
-----   Message from Trey Sailors, [place] sent at 02/05/2020  8:26 AM EST ----- Sugars extremely uncontrolled as well as cholesterol. Her TSH is slightly elevated too which can sometimes mean an underactive thyroid. She needs to be on cholesterol and diabetes medications. We have discussed this in depth at her previous visit though she was not willing to consider medication at that time. Follow up with her PCP to discuss management.

## 2020-02-05 NOTE — Telephone Encounter (Signed)
Pt called in for lab results.   I read her the message from Osvaldo Angst, PA-C dated 02/05/2020 at 8:26 AM.    Pt stated,  "I can't do anything for 3-4 months because I just started a new job".  I encouraged her to please call us back so these issues could be addressed.   She said,  "Ok" and ended the call.

## 2020-02-05 NOTE — Telephone Encounter (Signed)
Called patient no answer, left voicemail message for patient to return call. If patient calls back ok for [facility] to advise patient of message.

## 2020-02-05 NOTE — Telephone Encounter (Signed)
Attempted to reach pt. Left message to call back. 

## 2020-02-10 NOTE — Telephone Encounter (Addendum)
Patient was advised and states that she just started her new job and can not take off of work until after 90 days. I advised patient that she could schedule appointment for when her 90 days is over and she stated she will call back to make the appointment with Maurine Minister.  Patient also asked if she need any medication refills would Maurine Minister fill them for her until she can come in the office and I advised her that the Lisinopril had 3 refills, but the Celexa and Ativan did not have anymore refills.

## 2020-02-11 NOTE — Telephone Encounter (Signed)
Agree with this plan. Can refill meds, if needed, as long as follow up scheduled.

## 2020-02-11 NOTE — Telephone Encounter (Signed)
LMTCB, okay for [facility] to advise  

## 2020-03-10 ENCOUNTER — Other Ambulatory Visit: Payer: Self-pay

## 2020-03-10 DIAGNOSIS — F329 Major depressive disorder, single episode, unspecified: Secondary | ICD-10-CM

## 2020-03-10 DIAGNOSIS — F419 Anxiety disorder, unspecified: Secondary | ICD-10-CM

## 2020-03-10 DIAGNOSIS — F32A Depression, unspecified: Secondary | ICD-10-CM

## 2020-03-10 MED ORDER — CITALOPRAM HYDROBROMIDE 20 MG PO TABS: ORAL_TABLET | ORAL | 0 refills | Status: DC

## 2020-03-10 NOTE — Telephone Encounter (Signed)
Copied from CRM [id number]. Topic: General - Other >> Mar 10, 2020  9:40 AM Herby Abraham C wrote: Reason for CRM: pt called in to request refill for citalopram (CELEXA) 20 MG tablet. Pt says that she just started a new job so she is unable to get off work to complete labs.   Pharmacy: [id number] [facility], [facility] - [address]  [address] [place] [address]

## 2020-06-01 ENCOUNTER — Other Ambulatory Visit: Payer: Self-pay | Admitting: Physician Assistant

## 2020-06-01 DIAGNOSIS — F419 Anxiety disorder, unspecified: Secondary | ICD-10-CM

## 2020-06-01 DIAGNOSIS — F32A Depression, unspecified: Secondary | ICD-10-CM

## 2020-06-01 NOTE — Telephone Encounter (Signed)
Requested medications are due for refill today?  Yes  Requested medications are on active medication list?  Yes  Last Refill:   03/10/2020  # 30 with no refills - appears to be courtesy refill, as there is a long notation about patient needing to schedule appointment to address all her health issues and to be evaluated in taking this medication safely.    Future visit scheduled?  No   Notes to Clinic:  Please see above.

## 2020-08-27 ENCOUNTER — Other Ambulatory Visit: Payer: Self-pay | Admitting: Family Medicine

## 2020-08-27 DIAGNOSIS — F32A Depression, unspecified: Secondary | ICD-10-CM

## 2020-08-27 DIAGNOSIS — F419 Anxiety disorder, unspecified: Secondary | ICD-10-CM

## 2020-08-27 NOTE — Telephone Encounter (Signed)
Requested Prescriptions  Pending Prescriptions Disp Refills   citalopram (CELEXA) 20 MG tablet [Pharmacy Med Name: CITALOPRAM HBR 20 MG TABLET] 90 tablet 0    Sig: TAKE 1 TABLET BY MOUTH EVERYDAY AT BEDTIME     Psychiatry:  Antidepressants - SSRI Failed - 08/27/2020 10:01 AM      Failed - Completed PHQ-2 or PHQ-9 in the last 360 days.      Failed - Valid encounter within last 6 months    Recent Outpatient Visits          8 months ago Hypertension, unspecified type   [facility] [place], Lavella Hammock, [place]   1 year ago Anxiety and depression   [facility] Chrismon, Jodell Cipro, [place]   2 years ago Anxiety   [facility], Jodell Cipro, [place]   3 years ago Anxiety   [facility], Jodell Cipro, [place]   4 years ago Body aches   [facility], Jodell Cipro, [place]             One month courtesy refill with reminder for patient to schedule an appointment for an office visit.

## 2020-09-19 ENCOUNTER — Other Ambulatory Visit: Payer: Self-pay | Admitting: Family Medicine

## 2020-09-19 DIAGNOSIS — F419 Anxiety disorder, unspecified: Secondary | ICD-10-CM

## 2020-09-19 DIAGNOSIS — F32A Depression, unspecified: Secondary | ICD-10-CM

## 2020-10-07 ENCOUNTER — Encounter: Payer: Self-pay | Admitting: Family Medicine

## 2020-10-07 ENCOUNTER — Other Ambulatory Visit: Payer: Self-pay

## 2020-10-07 ENCOUNTER — Ambulatory Visit: Payer: Commercial Managed Care - PPO | Admitting: Family Medicine

## 2020-10-07 VITALS — BP 119/74 | HR 90 | Temp 98.6°F | Resp 16 | Wt 153.0 lb

## 2020-10-07 DIAGNOSIS — F419 Anxiety disorder, unspecified: Secondary | ICD-10-CM

## 2020-10-07 DIAGNOSIS — E119 Type 2 diabetes mellitus without complications: Secondary | ICD-10-CM | POA: Diagnosis not present

## 2020-10-07 DIAGNOSIS — F32A Depression, unspecified: Secondary | ICD-10-CM | POA: Diagnosis not present

## 2020-10-07 LAB — POCT GLYCOSYLATED HEMOGLOBIN (HGB A1C)
Est. average glucose Bld gHb Est-mCnc: 163
Hemoglobin A1C: 7.3 % — AB (ref 4.0–5.6)

## 2020-10-07 MED ORDER — CITALOPRAM HYDROBROMIDE 20 MG PO TABS: ORAL_TABLET | ORAL | 3 refills | Status: DC

## 2020-10-07 NOTE — Progress Notes (Signed)
Established patient visit   Patient: Michelle Hendricks   DOB: 1966/11/07   54 y.o. Female  MRN: [id number] Visit Date: 10/07/2020  Today's healthcare provider: Dortha Kern, PA   Chief Complaint  Patient presents with  . Diabetes  . Hypertension  . Anxiety   Subjective    HPI  Hypertension, follow-up  BP Readings from Last 3 Encounters:  10/07/20 119/74  12/15/19 (!) 138/91  11/03/18 114/75   Wt Readings from Last 3 Encounters:  10/07/20 153 lb (69.4 kg)  12/15/19 171 lb (77.6 kg)  11/03/18 176 lb (79.8 kg)     Michelle Hendricks was last seen for hypertension 9 months ago.  BP at that visit was 138/91. Management since that visit includes increased Lisinopril to 10mg  daily.  Michelle Hendricks reports good compliance with treatment. Michelle Hendricks is not having side effects.  Michelle Hendricks is following a Regular diet. Michelle Hendricks is exercising. Michelle Hendricks does smoke.  Use of agents associated with hypertension: none.   Outside blood pressures are not checked. Symptoms: No chest pain No chest pressure  No palpitations No syncope  No dyspnea No orthopnea  No paroxysmal nocturnal dyspnea No lower extremity edema   Pertinent labs: Lab Results  Component Value Date   CHOL 280 (H) 02/04/2020   HDL 24 (L) 02/04/2020   LDLCALC Comment (A) 02/04/2020   TRIG 1,541 (HH) 02/04/2020   CHOLHDL 11.7 (H) 02/04/2020   Lab Results  Component Value Date   NA 137 02/04/2020   K 4.0 02/04/2020   CREATININE 0.64 02/04/2020   GFRNONAA 103 02/04/2020   GFRAA 119 02/04/2020   GLUCOSE 291 (H) 02/04/2020     The 10-year ASCVD risk score 04/05/2020 DC Jr., et al., 2013) is: 30%   ---------------------------------------------------------------------------------------------------  Anxiety, Follow-up  Michelle Hendricks was last seen for anxiety 9 months ago (seen by 2014, PA-C). Changes made at last visit include none; continue same medication.   Michelle Hendricks reports good compliance with treatment. Michelle Hendricks reports good tolerance of treatment. Michelle Hendricks is not  having side effects.   Michelle Hendricks feels her anxiety is severe and Worse since last visit.  Symptoms: No chest pain Yes difficulty concentrating  No dizziness Yes fatigue  Yes feelings of losing control Yes insomnia (trouble staying asleep)  Yes irritable No palpitations  No panic attacks Yes racing thoughts  No shortness of breath No sweating  No tremors/shakes    GAD-7 Results No flowsheet data found.  PHQ-9 Scores PHQ9 SCORE ONLY 10/07/2020 11/03/2018 05/27/2017  PHQ-9 Total Score 11 12 11     ---------------------------------------------------------------------------------------------------  Diabetes Mellitus Type II, Follow-up  Lab Results  Component Value Date   HGBA1C 7.3 (A) 10/07/2020   HGBA1C 10.3 (H) 11/13/2018   HGBA1C 8.2 10/04/2017   Wt Readings from Last 3 Encounters:  10/07/20 153 lb (69.4 kg)  12/15/19 171 lb (77.6 kg)  11/03/18 176 lb (79.8 kg)   Last seen for diabetes 9 months ago (seen by 02/12/20, PA-C).  During that visit we attempted to take a1c however it was too high to read on our machine. Our machine typically reads up to ~14% A1c. Counseled patient this is extremely concerning and life threatening. Patient was not receptive to any messages about her health at that time. We provided resources for free and low cost clinics if Michelle Hendricks was not able to secure health insurance. Counseled it is extremely important to follow up with her diabetes. Michelle Hendricks reports poor compliance with treatment. Michelle Hendricks is not having side effects.  Symptoms:  Yes fatigue No foot ulcerations  No appetite changes No nausea  No paresthesia of the feet  No polydipsia  No polyuria No visual disturbances   No vomiting     Home blood sugar records: blood sugars are not checked  Episodes of hypoglycemia? No    Current insulin regiment: none Most Recent Eye Exam: not UTD Current exercise: walking Current diet habits: Ketogenic diet  Pertinent Labs: Lab Results  Component Value Date    CHOL 280 (H) 02/04/2020   HDL 24 (L) 02/04/2020   LDLCALC Comment (A) 02/04/2020   TRIG 1,541 (HH) 02/04/2020   CHOLHDL 11.7 (H) 02/04/2020   Lab Results  Component Value Date   NA 137 02/04/2020   K 4.0 02/04/2020   CREATININE 0.64 02/04/2020   GFRNONAA 103 02/04/2020   GFRAA 119 02/04/2020   GLUCOSE 291 (H) 02/04/2020     ---------------------------------------------------------------------------------------------------  Past Medical History:  Diagnosis Date  . Anxiety   . Depression   . Diabetes mellitus without complication (HCC)   . Hypertension    Past Surgical History:  Procedure Laterality Date  . NASAL SINUS SURGERY     Social History   Tobacco Use  . Smoking status: Current Some Day Smoker    Packs/day: 0.75    Types: Cigarettes  . Smokeless tobacco: Never Used  Substance Use Topics  . Alcohol use: No    Alcohol/week: 0.0 standard drinks  . Drug use: Not on file   Family History  Problem Relation Age of Onset  . Alzheimer's disease Maternal Grandmother   . Cancer Maternal Grandfather   . Diabetes Maternal Grandfather   . Heart attack Father    Allergies  Allergen Reactions  . Penicillins Rash and Other (See Comments)    Has patient had a PCN reaction causing immediate rash, facial/tongue/throat swelling, SOB or lightheadedness with hypotension: No Has patient had a PCN reaction causing severe rash involving mucus membranes or skin necrosis: No Has patient had a PCN reaction that required hospitalization No Has patient had a PCN reaction occurring within the last 10 years: No If all of the above answers are "NO", then may proceed with Cephalosporin use.       Medications: Outpatient Medications Prior to Visit  Medication Sig  . citalopram (CELEXA) 20 MG tablet TAKE 1 TABLET BY MOUTH EVERYDAY AT BEDTIME  . lisinopril (ZESTRIL) 10 MG tablet Take 1 tablet (10 mg total) by mouth daily.  Marland Kitchen LORazepam (ATIVAN) 0.5 MG tablet Take 1 tablet (0.5 mg total)  by mouth 2 (two) times daily.  . pioglitazone (ACTOS) 15 MG tablet Take 15 mg by mouth daily.   No facility-administered medications prior to visit.    Review of Systems  Constitutional: Positive for fatigue. Negative for appetite change, chills and fever.  Respiratory: Negative for chest tightness and shortness of breath.   Cardiovascular: Negative for chest pain and palpitations.  Gastrointestinal: Negative for abdominal pain, nausea and vomiting.  Neurological: Negative for dizziness and weakness.  Psychiatric/Behavioral: Positive for agitation and sleep disturbance. The patient is nervous/anxious.     Last CBC Lab Results  Component Value Date   WBC 8.6 02/04/2020   HGB 14.3 02/04/2020   HCT 41.7 02/04/2020   MCV 90 02/04/2020   MCH 31.0 02/04/2020   RDW 12.8 02/04/2020   PLT 285 02/04/2020   Last metabolic panel Lab Results  Component Value Date   GLUCOSE 291 (H) 02/04/2020   NA 137 02/04/2020   K 4.0 02/04/2020  CL 98 02/04/2020   CO2 20 02/04/2020   BUN 13 02/04/2020   CREATININE 0.64 02/04/2020   GFRNONAA 103 02/04/2020   GFRAA 119 02/04/2020   CALCIUM 9.0 02/04/2020   PROT 6.7 02/04/2020   ALBUMIN 4.3 02/04/2020   LABGLOB 2.4 02/04/2020   AGRATIO 1.8 02/04/2020   BILITOT <0.2 02/04/2020   ALKPHOS 82 02/04/2020   AST 17 02/04/2020   ALT 23 02/04/2020   ANIONGAP 11 11/11/2015   Last lipids Lab Results  Component Value Date   CHOL 280 (H) 02/04/2020   HDL 24 (L) 02/04/2020   LDLCALC Comment (A) 02/04/2020   TRIG 1,541 (HH) 02/04/2020   CHOLHDL 11.7 (H) 02/04/2020   Last hemoglobin A1c Lab Results  Component Value Date   HGBA1C 7.3 (A) 10/07/2020      Objective    BP 119/74 (BP Location: Right Arm, Patient Position: Sitting, Cuff Size: Normal)   Pulse 90   Temp 98.6 F (37 C) (Oral)   Resp 16   Wt 153 lb (69.4 kg)   LMP 04/16/2015   BMI 28.91 kg/m   Physical Exam Constitutional:      General: Michelle Hendricks is not in acute distress.     Appearance: Michelle Hendricks is well-developed.  HENT:     Head: Normocephalic and atraumatic.     Right Ear: Hearing and tympanic membrane normal.     Left Ear: Hearing and tympanic membrane normal.     Nose: Nose normal.  Eyes:     General: Lids are normal. No scleral icterus.       Right eye: No discharge.        Left eye: No discharge.     Conjunctiva/sclera: Conjunctivae normal.  Cardiovascular:     Rate and Rhythm: Normal rate and regular rhythm.     Heart sounds: Normal heart sounds.  Pulmonary:     Effort: Pulmonary effort is normal. No respiratory distress.     Breath sounds: Normal breath sounds.  Abdominal:     General: Bowel sounds are normal.     Palpations: Abdomen is soft.  Musculoskeletal:        General: Normal range of motion.  Skin:    Findings: No lesion or rash.  Neurological:     Mental Status: Michelle Hendricks is alert and oriented to person, place, and time.  Psychiatric:        Speech: Speech normal.        Behavior: Behavior normal.        Thought Content: Thought content normal.     Diabetic Foot Form - Detailed   Diabetic Foot Exam - detailed Diabetic Foot exam was performed with the following findings: Yes 10/07/2020  4:22 PM  Visual Foot Exam completed.: Yes  Can the patient see the bottom of their feet?: Yes Are the shoes appropriate in style and fit?: Yes Is there swelling or and abnormal foot shape?: No Is there a claw toe deformity?: No Is there elevated skin temparature?: No Is there foot or ankle muscle weakness?: No Normal Range of Motion: Yes Pulse Foot Exam completed.: Yes  Right posterior Tibialias: Present Left posterior Tibialias: Present  Right Dorsalis Pedis: Present Left Dorsalis Pedis: Present  Sensory Foot Exam Completed.: Yes Semmes-Weinstein Monofilament Test R Site 1-Great Toe: Pos L Site 1-Great Toe: Pos         Results for orders placed or performed in visit on 10/07/20  POCT HgB A1C  Result Value Ref Range   Hemoglobin A1C 7.3 (A)  4.0  - 5.6 %   Est. average glucose Bld gHb Est-mCnc 163     Assessment & Plan     1. Type 2 diabetes mellitus without complication, without long-term current use of insulin (HCC) Hgb A1C was 7.3% today since losing 18 lbs after going on a Keto Diet. No longer taking Actos. Recheck labs and continue efforts to lose a little more weight. Encouraged to get annual eye exam. Normal foot exam today. - POCT HgB A1C - CBC with Differential/Platelet - Comprehensive metabolic panel - Lipid panel  2. Anxiety Increase in stress and sleep is not very restful. Sleeping 7-8 hours a night but waking 2-3 times. States husband reports Michelle Hendricks is restless when he watches her sleep. Increase Citalopram. States Michelle Hendricks has used the Lorazepam once or twice. - citalopram (CELEXA) 20 MG tablet; TAKE 1.5 TABLET BY MOUTH EVERYDAY AT BEDTIME  Dispense: 45 tablet; Refill: 3 - CBC with Differential/Platelet - TSH - T4  3. Depression, unspecified depression type Feels stress associated with work causing more anxiety. No suicidal ideation. Will increase Citalopram to 30 mg qd and recheck chemistry.  - citalopram (CELEXA) 20 MG tablet; TAKE 1.5 TABLET BY MOUTH EVERYDAY AT BEDTIME  Dispense: 45 tablet; Refill: 3 - CBC with Differential/Platelet - Comprehensive metabolic panel - TSH - T4   No follow-ups on file.      Haywood PaoI, Marrio Scribner, PA, have reviewed all documentation for this visit. The documentation on 10/07/20 for the exam, diagnosis, procedures, and orders are all accurate and complete.    Dortha Kernennis Crescent [place], PA  [facility] [phone] (phone) [phone] (fax)  [facility]

## 2020-10-29 ENCOUNTER — Other Ambulatory Visit: Payer: Self-pay | Admitting: Family Medicine

## 2020-10-29 DIAGNOSIS — F419 Anxiety disorder, unspecified: Secondary | ICD-10-CM

## 2020-10-29 DIAGNOSIS — F32A Depression, unspecified: Secondary | ICD-10-CM

## 2020-10-29 NOTE — Telephone Encounter (Signed)
Requested Prescriptions  Pending Prescriptions Disp Refills  . citalopram (CELEXA) 20 MG tablet [Pharmacy Med Name: CITALOPRAM HBR 20 MG TABLET] 135 tablet 1    Sig: TAKE 1.5 TABLET BY MOUTH EVERYDAY AT BEDTIME     Psychiatry:  Antidepressants - SSRI Passed - 10/29/2020  1:31 PM      Passed - Completed PHQ-2 or PHQ-9 in the last 360 days      Passed - Valid encounter within last 6 months    Recent Outpatient Visits          3 weeks ago Type 2 diabetes mellitus without complication, without long-term current use of insulin [facility])   [facility] Chrismon, [place], [place]   10 months ago Hypertension, unspecified type   [facility] [place], Lavella Hammock, PA-C   1 year ago Anxiety and depression   [facility], Jodell Cipro, [place]   2 years ago Anxiety   [facility], Jodell Cipro, [place]   3 years ago Anxiety   [facility], [place], [place]

## 2020-11-03 LAB — TSH: TSH: 2.84 u[IU]/mL (ref 0.450–4.500)

## 2020-11-03 LAB — CBC WITH DIFFERENTIAL/PLATELET
Basophils Absolute: 0 x10E3/uL (ref 0.0–0.2)
Basos: 0 %
EOS (ABSOLUTE): 0.2 x10E3/uL (ref 0.0–0.4)
Eos: 3 %
Hematocrit: 41.7 % (ref 34.0–46.6)
Hemoglobin: 14.5 g/dL (ref 11.1–15.9)
Immature Grans (Abs): 0 x10E3/uL (ref 0.0–0.1)
Immature Granulocytes: 0 %
Lymphocytes Absolute: 1.5 x10E3/uL (ref 0.7–3.1)
Lymphs: 20 %
MCH: 31.2 pg (ref 26.6–33.0)
MCHC: 34.8 g/dL (ref 31.5–35.7)
MCV: 90 fL (ref 79–97)
Monocytes Absolute: 0.5 x10E3/uL (ref 0.1–0.9)
Monocytes: 7 %
Neutrophils Absolute: 5 x10E3/uL (ref 1.4–7.0)
Neutrophils: 70 %
Platelets: 271 x10E3/uL (ref 150–450)
RBC: 4.65 x10E6/uL (ref 3.77–5.28)
RDW: 12 % (ref 11.7–15.4)
WBC: 7.2 x10E3/uL (ref 3.4–10.8)

## 2020-11-03 LAB — COMPREHENSIVE METABOLIC PANEL
ALT: 26 IU/L (ref 0–32)
AST: 22 IU/L (ref 0–40)
Albumin/Globulin Ratio: 1.6 (ref 1.2–2.2)
Albumin: 4.6 g/dL (ref 3.8–4.9)
Alkaline Phosphatase: 84 IU/L (ref 44–121)
BUN/Creatinine Ratio: 20 (ref 9–23)
BUN: 14 mg/dL (ref 6–24)
Bilirubin Total: 0.3 mg/dL (ref 0.0–1.2)
CO2: 25 mmol/L (ref 20–29)
Calcium: 9.9 mg/dL (ref 8.7–10.2)
Chloride: 97 mmol/L (ref 96–106)
Creatinine, Ser: 0.7 mg/dL (ref 0.57–1.00)
GFR calc Af Amer: 115 mL/min/{1.73_m2} (ref >59)
GFR calc non Af Amer: 100 mL/min/{1.73_m2} (ref >59)
Globulin, Total: 2.8 g/dL (ref 1.5–4.5)
Glucose: 168 mg/dL — ABNORMAL HIGH (ref 65–99)
Potassium: 4.9 mmol/L (ref 3.5–5.2)
Sodium: 136 mmol/L (ref 134–144)
Total Protein: 7.4 g/dL (ref 6.0–8.5)

## 2020-11-03 LAB — T4: T4, Total: 7.8 ug/dL (ref 4.5–12.0)

## 2020-11-03 LAB — LIPID PANEL
Chol/HDL Ratio: 5.5 ratio — ABNORMAL HIGH (ref 0.0–4.4)
Cholesterol, Total: 220 mg/dL — ABNORMAL HIGH (ref 100–199)
HDL: 40 mg/dL
LDL Chol Calc (NIH): 124 mg/dL — ABNORMAL HIGH (ref 0–99)
Triglycerides: 319 mg/dL — ABNORMAL HIGH (ref 0–149)
VLDL Cholesterol Cal: 56 mg/dL — ABNORMAL HIGH (ref 5–40)

## 2020-12-05 ENCOUNTER — Other Ambulatory Visit: Payer: Self-pay | Admitting: Physician Assistant

## 2020-12-05 DIAGNOSIS — I10 Essential (primary) hypertension: Secondary | ICD-10-CM

## 2020-12-05 NOTE — Telephone Encounter (Signed)
Requested Prescriptions  Pending Prescriptions Disp Refills  . lisinopril (ZESTRIL) 10 MG tablet [Pharmacy Med Name: LISINOPRIL 10 MG TABLET] 90 tablet 0    Sig: TAKE 1 TABLET BY MOUTH EVERY DAY     Cardiovascular:  ACE Inhibitors Passed - 12/05/2020  1:46 AM      Passed - Cr in normal range and within 180 days    Creatinine, Ser  Date Value Ref Range Status  11/02/2020 0.70 0.57 - 1.00 mg/dL Final         Passed - K in normal range and within 180 days    Potassium  Date Value Ref Range Status  11/02/2020 4.9 3.5 - 5.2 mmol/L Final         Passed - Patient is not pregnant      Passed - Last BP in normal range    BP Readings from Last 1 Encounters:  10/07/20 119/74         Passed - Valid encounter within last 6 months    Recent Outpatient Visits          1 month ago Type 2 diabetes mellitus without complication, without long-term current use of insulin [facility])   [facility] Chrismon, Jodell Cipro, PA-C   11 months ago Hypertension, unspecified type   [facility] [place], [place], [place]   2 years ago Anxiety and depression   [facility], Jodell Cipro, PA-C   3 years ago Anxiety   [facility], Jodell Cipro, PA-C   3 years ago Anxiety   [facility], Jodell Cipro, [place]

## 2020-12-12 ENCOUNTER — Telehealth: Payer: Self-pay

## 2020-12-12 NOTE — Telephone Encounter (Signed)
Should have psychiatry referral to get medications stablilized to control symptoms.

## 2020-12-12 NOTE — Telephone Encounter (Signed)
Citalopram was increased in November    Copied from CRM [id number]. Topic: General - Inquiry >> Dec 12, 2020 12:11 PM Adrian Prince D wrote: Reason for CRM: Patient would like a call back in reference to a medication that was prescribed. She states that the celexa is no longer working. She can be reached at [phone]. Please advise

## 2020-12-13 ENCOUNTER — Other Ambulatory Visit: Payer: Self-pay | Admitting: Family Medicine

## 2020-12-13 ENCOUNTER — Other Ambulatory Visit: Payer: Self-pay

## 2020-12-13 DIAGNOSIS — F418 Other specified anxiety disorders: Secondary | ICD-10-CM

## 2020-12-13 NOTE — Telephone Encounter (Signed)
Placed order with F41.8 - Depression with anxiety and it seemed to go through for scheduling.

## 2020-12-13 NOTE — Telephone Encounter (Signed)
Michelle Hendricks could you please order referral with appropriate diagnosis code.  It is not allowing me to put in the order without more specified code

## 2021-03-01 ENCOUNTER — Other Ambulatory Visit: Payer: Self-pay | Admitting: Family Medicine

## 2021-03-01 DIAGNOSIS — I10 Essential (primary) hypertension: Secondary | ICD-10-CM

## 2021-03-01 NOTE — Telephone Encounter (Signed)
Requested Prescriptions  Pending Prescriptions Disp Refills  . lisinopril (ZESTRIL) 10 MG tablet [Pharmacy Med Name: LISINOPRIL 10 MG TABLET] 90 tablet 0    Sig: TAKE 1 TABLET BY MOUTH EVERY DAY     Cardiovascular:  ACE Inhibitors Passed - 03/01/2021  2:27 AM      Passed - Cr in normal range and within 180 days    Creatinine, Ser  Date Value Ref Range Status  11/02/2020 0.70 0.57 - 1.00 mg/dL Final         Passed - K in normal range and within 180 days    Potassium  Date Value Ref Range Status  11/02/2020 4.9 3.5 - 5.2 mmol/L Final         Passed - Patient is not pregnant      Passed - Last BP in normal range    BP Readings from Last 1 Encounters:  10/07/20 119/74         Passed - Valid encounter within last 6 months    Recent Outpatient Visits          4 months ago Type 2 diabetes mellitus without complication, without long-term current use of insulin (HCC)   [facility] Chrismon, Jodell Cipro, PA-C   1 year ago Hypertension, unspecified type   [facility] [place], [place], [place]   2 years ago Anxiety and depression   [facility], Jodell Cipro, PA-C   3 years ago Anxiety   [facility], Jodell Cipro, PA-C   3 years ago Anxiety   [facility], Jodell Cipro, [place]

## 2021-04-27 ENCOUNTER — Other Ambulatory Visit: Payer: Self-pay | Admitting: Family Medicine

## 2021-04-27 DIAGNOSIS — F419 Anxiety disorder, unspecified: Secondary | ICD-10-CM

## 2021-04-27 DIAGNOSIS — F32A Depression, unspecified: Secondary | ICD-10-CM

## 2021-04-27 NOTE — Telephone Encounter (Signed)
Patient needs office visit for further refills. Requested Prescriptions  Pending Prescriptions Disp Refills  . citalopram (CELEXA) 20 MG tablet [Pharmacy Med Name: CITALOPRAM HBR 20 MG TABLET] 45 tablet 0    Sig: TAKE 1.5 TABLET BY MOUTH EVERYDAY AT BEDTIME     Psychiatry:  Antidepressants - SSRI Failed - 04/27/2021  1:46 AM      Failed - Valid encounter within last 6 months    Recent Outpatient Visits          6 months ago Type 2 diabetes mellitus without complication, without long-term current use of insulin [facility])   [facility] Chrismon, Jodell Cipro, PA-C   1 year ago Hypertension, unspecified type   [facility] [place], [place], [place]   2 years ago Anxiety and depression   [facility], Jodell Cipro, PA-C   3 years ago Anxiety   [facility], Jodell Cipro, PA-C   3 years ago Anxiety   [facility], Jodell Cipro, PA-C             Passed - Completed PHQ-2 or PHQ-9 in the last 360 days

## 2021-05-27 ENCOUNTER — Other Ambulatory Visit: Payer: Self-pay | Admitting: Family Medicine

## 2021-05-27 DIAGNOSIS — F419 Anxiety disorder, unspecified: Secondary | ICD-10-CM

## 2021-05-27 DIAGNOSIS — F32A Depression, unspecified: Secondary | ICD-10-CM

## 2021-05-27 NOTE — Telephone Encounter (Signed)
Requested medication (s) are due for refill today: yes  Requested medication (s) are on the active medication list: yes  Last refill:  04/27/21  Future visit scheduled: no  Notes to clinic:  needs appointment--- Called pt and LM on VM to call office to make appt- call back number provided   Requested Prescriptions  Pending Prescriptions Disp Refills   citalopram (CELEXA) 20 MG tablet [Pharmacy Med Name: CITALOPRAM HBR 20 MG TABLET] 135 tablet 1    Sig: TAKE 1.5 TABLET BY MOUTH EVERYDAY AT BEDTIME      Psychiatry:  Antidepressants - SSRI Failed - 05/27/2021 12:31 PM      Failed - Valid encounter within last 6 months    Recent Outpatient Visits           7 months ago Type 2 diabetes mellitus without complication, without long-term current use of insulin (HCC)   [facility] Chrismon, Jodell Cipro, PA-C   1 year ago Hypertension, unspecified type   [facility] [place], [place], [place]   2 years ago Anxiety and depression   [facility], Jodell Cipro, PA-C   3 years ago Anxiety   [facility], Jodell Cipro, PA-C   4 years ago Anxiety   [facility], Jodell Cipro, PA-C                Passed - Completed PHQ-2 or PHQ-9 in the last 360 days

## 2021-05-29 ENCOUNTER — Other Ambulatory Visit: Payer: Self-pay | Admitting: Family Medicine

## 2021-05-29 DIAGNOSIS — F32A Depression, unspecified: Secondary | ICD-10-CM

## 2021-05-29 DIAGNOSIS — F419 Anxiety disorder, unspecified: Secondary | ICD-10-CM

## 2021-05-31 ENCOUNTER — Other Ambulatory Visit: Payer: Self-pay | Admitting: Family Medicine

## 2021-05-31 DIAGNOSIS — I10 Essential (primary) hypertension: Secondary | ICD-10-CM

## 2021-06-01 NOTE — Telephone Encounter (Signed)
   Notes to clinic:  DX Code Needed     Requested Prescriptions  Pending Prescriptions Disp Refills   lisinopril (ZESTRIL) 10 MG tablet [Pharmacy Med Name: LISINOPRIL 10 MG TABLET] 90 tablet 0    Sig: TAKE 1 TABLET BY MOUTH EVERY DAY      Cardiovascular:  ACE Inhibitors Failed - 05/31/2021  5:18 PM      Failed - Cr in normal range and within 180 days    Creatinine, Ser  Date Value Ref Range Status  11/02/2020 0.70 0.57 - 1.00 mg/dL Final          Failed - K in normal range and within 180 days    Potassium  Date Value Ref Range Status  11/02/2020 4.9 3.5 - 5.2 mmol/L Final          Failed - Valid encounter within last 6 months    Recent Outpatient Visits           7 months ago Type 2 diabetes mellitus without complication, without long-term current use of insulin [facility])   [facility] Chrismon, Jodell Cipro, PA-C   1 year ago Hypertension, unspecified type   [facility] [place], [place], [place]   2 years ago Anxiety and depression   [facility], Jodell Cipro, PA-C   3 years ago Anxiety   [facility], Jodell Cipro, PA-C   4 years ago Anxiety   [facility], Jodell Cipro, [place] - Patient is not pregnant      Passed - Last BP in normal range    BP Readings from Last 1 Encounters:  10/07/20 119/74

## 2021-07-03 ENCOUNTER — Ambulatory Visit: Payer: 59 | Admitting: Family Medicine

## 2021-07-03 ENCOUNTER — Other Ambulatory Visit: Payer: Self-pay

## 2021-07-03 ENCOUNTER — Encounter: Payer: Self-pay | Admitting: Family Medicine

## 2021-07-03 VITALS — BP 131/78 | HR 112 | Temp 99.5°F | Wt 163.0 lb

## 2021-07-03 DIAGNOSIS — H6982 Other specified disorders of Eustachian tube, left ear: Secondary | ICD-10-CM | POA: Diagnosis not present

## 2021-07-03 DIAGNOSIS — E119 Type 2 diabetes mellitus without complications: Secondary | ICD-10-CM

## 2021-07-03 DIAGNOSIS — I10 Essential (primary) hypertension: Secondary | ICD-10-CM | POA: Diagnosis not present

## 2021-07-03 DIAGNOSIS — F32A Depression, unspecified: Secondary | ICD-10-CM

## 2021-07-03 DIAGNOSIS — F419 Anxiety disorder, unspecified: Secondary | ICD-10-CM

## 2021-07-03 NOTE — Progress Notes (Signed)
Established patient visit   Patient: Michelle Hendricks   DOB: [birth year]   55 y.o. Female  MRN: [id number] Visit Date: 07/03/2021  Today's healthcare provider: Dortha Kern, PA-C   No chief complaint on file.  Subjective    HPI  Diabetes Mellitus Type II, Follow-up  Lab Results  Component Value Date   HGBA1C 7.3 (A) 10/07/2020   HGBA1C 10.3 (H) 11/13/2018   HGBA1C 8.2 10/04/2017   Wt Readings from Last 3 Encounters:  07/03/21 163 lb (73.9 kg)  10/07/20 153 lb (69.4 kg)  12/15/19 171 lb (77.6 kg)   Last seen for diabetes 7 months ago.  Management since then includes none. She reports good compliance with treatment. Patient is diet controlled at this time. Symptoms: No fatigue No foot ulcerations  No appetite changes No nausea  No paresthesia of the feet  No polydipsia  No polyuria No visual disturbances   No vomiting     Home blood sugar records:  not being checked  Episodes of hypoglycemia? No    Most Recent Eye Exam: about 1 year ago Current exercise: walking Current diet habits: in general, a "healthy" diet    Pertinent Labs: Lab Results  Component Value Date   CHOL 220 (H) 11/02/2020   HDL 40 11/02/2020   LDLCALC 124 (H) 11/02/2020   TRIG 319 (H) 11/02/2020   CHOLHDL 5.5 (H) 11/02/2020   Lab Results  Component Value Date   NA 136 11/02/2020   K 4.9 11/02/2020   CREATININE 0.70 11/02/2020   GFRNONAA 100 11/02/2020   GFRAA 115 11/02/2020   GLUCOSE 168 (H) 11/02/2020     --------------------------------------------------------------------------------------------------- Hypertension, follow-up  BP Readings from Last 3 Encounters:  07/03/21 131/78  10/07/20 119/74  12/15/19 (!) 138/91   Wt Readings from Last 3 Encounters:  07/03/21 163 lb (73.9 kg)  10/07/20 153 lb (69.4 kg)  12/15/19 171 lb (77.6 kg)     She was last seen for hypertension 8 months ago.  BP at that visit was none. Management since that visit includes none.  She reports  good compliance with treatment. She is not having side effects.  She is following a Regular diet. She is exercising. She does smoke.  Use of agents associated with hypertension: none.   Outside blood pressures are not being checked. Symptoms: No chest pain No chest pressure  No palpitations No syncope  No dyspnea No orthopnea  No paroxysmal nocturnal dyspnea No lower extremity edema   Pertinent labs: Lab Results  Component Value Date   CHOL 220 (H) 11/02/2020   HDL 40 11/02/2020   LDLCALC 124 (H) 11/02/2020   TRIG 319 (H) 11/02/2020   CHOLHDL 5.5 (H) 11/02/2020   Lab Results  Component Value Date   NA 136 11/02/2020   K 4.9 11/02/2020   CREATININE 0.70 11/02/2020   GFRNONAA 100 11/02/2020   GFRAA 115 11/02/2020   GLUCOSE 168 (H) 11/02/2020     The 10-year ASCVD risk score Denman George DC Jr., et al., 2013) is: 17.1%   --------------------------------------------------------------------------------------------------- Depression, Follow-up  She  was last seen for this 8 months ago. Changes made at last visit include increase of Citalopram.   She reports good compliance with treatment. She is not having side effects.   She reports good tolerance of treatment. Current symptoms include:  nnone She feels she is Improved since last visit.  Depression screen [facility] 2/9 07/03/2021 10/07/2020 11/03/2018  Decreased Interest 1 2 1   Down, Depressed, Hopeless  1 1 1   PHQ - 2 Score 2 3 2   Altered sleeping 2 2 2   Tired, decreased energy 2 3 2   Change in appetite 1 0 2  Feeling bad or failure about yourself  1 1 1   Trouble concentrating 3 2 3   Moving slowly or fidgety/restless 0 0 0  Suicidal thoughts 0 0 0  PHQ-9 Score 11 11 12   Difficult doing work/chores Very difficult Very difficult Very difficult    ----------------------------------------------------------------------------------------- Patient would also like to have her left ear checked.  She has had about 3 months of pain and  pressure.  She also think she has swollen glands in her neck.  No drainage or decreased hearing.  She has been using different decongestants but they don't seem to clear it all the way  Past Medical History:  Diagnosis Date   Anxiety    Depression    Diabetes mellitus without complication (HCC)    Hypertension    Past Surgical History:  Procedure Laterality Date   NASAL SINUS SURGERY     Social History   Tobacco Use   Smoking status: Some Days    Packs/day: 0.75    Types: Cigarettes   Smokeless tobacco: Never  Substance Use Topics   Alcohol use: No    Alcohol/week: 0.0 standard drinks   Family Status  Relation Name Status   MGM  Deceased   MGF  Deceased   Mother  Alive   Father  Deceased at age 54       09-02-2015   PGM  Deceased   PGF  Deceased   Allergies  Allergen Reactions   Penicillins Rash and Other (See Comments)    Has patient had a PCN reaction causing immediate rash, facial/tongue/throat swelling, SOB or lightheadedness with hypotension: No Has patient had a PCN reaction causing severe rash involving mucus membranes or skin necrosis: No Has patient had a PCN reaction that required hospitalization No Has patient had a PCN reaction occurring within the last 10 years: No If all of the above answers are "NO", then may proceed with Cephalosporin use.       Medications: Outpatient Medications Prior to Visit  Medication Sig   citalopram (CELEXA) 20 MG tablet TAKE 1.5 TABLET BY MOUTH EVERYDAY AT BEDTIME   lisinopril (ZESTRIL) 10 MG tablet Take 1 tablet (10 mg total) by mouth daily. Please schedule office visit before any future refills.   LORazepam (ATIVAN) 0.5 MG tablet Take 1 tablet (0.5 mg total) by mouth 2 (two) times daily.   No facility-administered medications prior to visit.    Review of Systems  HENT:  Positive for congestion, ear pain, postnasal drip, rhinorrhea, sinus pressure, sinus pain, sore throat and trouble swallowing. Negative for ear discharge  and tinnitus.   Respiratory:  Negative for cough, shortness of breath and wheezing.   Cardiovascular:  Negative for chest pain, palpitations and leg swelling.      Objective    BP 131/78 (BP Location: Right Arm, Patient Position: Sitting, Cuff Size: Normal)   Pulse (!) 112   Temp 99.5 F (37.5 C) (Oral)   Wt 163 lb (73.9 kg)   LMP 04/16/2015   SpO2 97%   BMI 30.80 kg/m     Physical Exam Constitutional:      General: She is not in acute distress.    Appearance: She is well-developed.  HENT:     Head: Normocephalic and atraumatic.     Right Ear: Hearing and tympanic membrane normal.  Left Ear: Hearing and tympanic membrane normal.     Nose: Nose normal.     Mouth/Throat:     Pharynx: Oropharynx is clear.  Eyes:     General: Lids are normal. No scleral icterus.       Right eye: No discharge.        Left eye: No discharge.     Conjunctiva/sclera: Conjunctivae normal.  Cardiovascular:     Rate and Rhythm: Regular rhythm. Tachycardia present.  Pulmonary:     Effort: Pulmonary effort is normal. No respiratory distress.     Breath sounds: Normal breath sounds.  Abdominal:     General: Bowel sounds are normal.     Palpations: Abdomen is soft.  Musculoskeletal:        General: Normal range of motion.     Cervical back: Neck supple.  Skin:    Findings: No lesion or rash.  Neurological:     Mental Status: She is alert and oriented to person, place, and time.  Psychiatric:        Speech: Speech normal.        Behavior: Behavior normal.        Thought Content: Thought content normal.     No results found for any visits on 07/03/21.  Assessment & Plan     1. Type 2 diabetes mellitus without complication, without long-term current use of insulin (HCC) Controlled by diet and exercise. Has lost 10 more pounds since last office visit in November 2021. Last Hgb A1C was 7.3. Recheck labs. Encouraged to get annual ophthalmology exam. - CBC with Differential/Platelet -  Comprehensive metabolic panel - Hemoglobin A1c - Lipid Panel With LDL/HDL Ratio  2. Anxiety and depression Change in jobs has taken away majority of stress. No longer using the Hydroxyzine. Still taking the Celexa 20 mg  1.5 tablets hs. Will labs and continue Celexa. - CBC with Differential/Platelet - Comprehensive metabolic panel - TSH  3. Primary hypertension Well controlled BP on the Lisinopril. Recheck labs. Some tachycardia [place] the beach prior to office visit and taking Allegra-D for sinus drainage. - CBC with Differential/Platelet - Comprehensive metabolic panel - Lipid Panel With LDL/HDL Ratio - TSH  4. Dysfunction of left eustachian tube Recurrent left ear pressure discomfort and PND with swollen sensation in throat over the past 3 months. May use Flonase nasal spray. Check CBC for infection or allergies. - CBC with Differential/Platelet   No follow-ups on file.      I, Huey Scalia, PA-C, have reviewed all documentation for this visit. The documentation on 07/03/21 for the exam, diagnosis, procedures, and orders are all accurate and complete.    Dortha Kern, PA-C  [facility] [employer] [facility] [phone] (phone) [phone] (fax)  [facility]

## 2021-07-08 LAB — CBC WITH DIFFERENTIAL/PLATELET
Basophils Absolute: 0 10*3/uL (ref 0.0–0.2)
Basos: 1 %
EOS (ABSOLUTE): 0.1 10*3/uL (ref 0.0–0.4)
Eos: 1 %
Hematocrit: 42.5 % (ref 34.0–46.6)
Hemoglobin: 14.7 g/dL (ref 11.1–15.9)
Immature Grans (Abs): 0 10*3/uL (ref 0.0–0.1)
Immature Granulocytes: 0 %
Lymphocytes Absolute: 2.3 10*3/uL (ref 0.7–3.1)
Lymphs: 31 %
MCH: 30.4 pg (ref 26.6–33.0)
MCHC: 34.6 g/dL (ref 31.5–35.7)
MCV: 88 fL (ref 79–97)
Monocytes Absolute: 0.4 10*3/uL (ref 0.1–0.9)
Monocytes: 6 %
Neutrophils Absolute: 4.4 10*3/uL (ref 1.4–7.0)
Neutrophils: 61 %
Platelets: 291 10*3/uL (ref 150–450)
RBC: 4.84 x10E6/uL (ref 3.77–5.28)
RDW: 11.9 % (ref 11.7–15.4)
WBC: 7.3 10*3/uL (ref 3.4–10.8)

## 2021-07-08 LAB — COMPREHENSIVE METABOLIC PANEL
ALT: 22 IU/L (ref 0–32)
AST: 22 IU/L (ref 0–40)
Albumin/Globulin Ratio: 1.9 (ref 1.2–2.2)
Albumin: 4.4 g/dL (ref 3.8–4.9)
Alkaline Phosphatase: 113 IU/L (ref 44–121)
BUN/Creatinine Ratio: 21 (ref 9–23)
BUN: 15 mg/dL (ref 6–24)
Bilirubin Total: 0.3 mg/dL (ref 0.0–1.2)
CO2: 22 mmol/L (ref 20–29)
Calcium: 10 mg/dL (ref 8.7–10.2)
Chloride: 99 mmol/L (ref 96–106)
Creatinine, Ser: 0.72 mg/dL (ref 0.57–1.00)
Globulin, Total: 2.3 g/dL (ref 1.5–4.5)
Glucose: 215 mg/dL — ABNORMAL HIGH (ref 65–99)
Potassium: 5 mmol/L (ref 3.5–5.2)
Sodium: 138 mmol/L (ref 134–144)
Total Protein: 6.7 g/dL (ref 6.0–8.5)
eGFR: 100 mL/min/{1.73_m2} (ref >59)

## 2021-07-08 LAB — HEMOGLOBIN A1C
Est. average glucose Bld gHb Est-mCnc: 197 mg/dL
Hgb A1c MFr Bld: 8.5 % — ABNORMAL HIGH (ref 4.8–5.6)

## 2021-07-08 LAB — LIPID PANEL WITH LDL/HDL RATIO
Cholesterol, Total: 204 mg/dL — ABNORMAL HIGH (ref 100–199)
HDL: 35 mg/dL — ABNORMAL LOW (ref >39)
LDL Chol Calc (NIH): 92 mg/dL (ref 0–99)
LDL/HDL Ratio: 2.6 ratio (ref 0.0–3.2)
Triglycerides: 467 mg/dL — ABNORMAL HIGH (ref 0–149)
VLDL Cholesterol Cal: 77 mg/dL — ABNORMAL HIGH (ref 5–40)

## 2021-07-08 LAB — TSH: TSH: 2.06 u[IU]/mL (ref 0.450–4.500)

## 2021-07-12 ENCOUNTER — Other Ambulatory Visit: Payer: Self-pay

## 2021-07-12 MED ORDER — SIMVASTATIN 20 MG PO TABS: 20.0000 mg | ORAL_TABLET | Freq: Every day | ORAL | 3 refills | Status: DC

## 2021-07-22 ENCOUNTER — Other Ambulatory Visit: Payer: Self-pay | Admitting: Family Medicine

## 2021-07-22 DIAGNOSIS — F32A Depression, unspecified: Secondary | ICD-10-CM

## 2021-07-22 DIAGNOSIS — F419 Anxiety disorder, unspecified: Secondary | ICD-10-CM

## 2021-07-23 NOTE — Telephone Encounter (Signed)
Requested Prescriptions  Pending Prescriptions Disp Refills  . citalopram (CELEXA) 20 MG tablet [Pharmacy Med Name: CITALOPRAM HBR 20 MG TABLET] 135 tablet 1    Sig: TAKE 1.5 TABLET BY MOUTH EVERYDAY AT BEDTIME     Psychiatry:  Antidepressants - SSRI Passed - 07/22/2021  9:01 PM      Passed - Completed PHQ-2 or PHQ-9 in the last 360 days      Passed - Valid encounter within last 6 months    Recent Outpatient Visits          2 weeks ago Type 2 diabetes mellitus without complication, without long-term current use of insulin (HCC)   [facility] [place], Jodell Cipro, PA-C   9 months ago Type 2 diabetes mellitus without complication, without long-term current use of insulin (HCC)   [facility], Jodell Cipro, PA-C   1 year ago Hypertension, unspecified type   [facility] [place], [place], [place]   2 years ago Anxiety and depression   [employer] [facility], Jodell Cipro, PA-C   3 years ago Anxiety   [employer] [facility], Jodell Cipro, [place]

## 2021-08-30 ENCOUNTER — Other Ambulatory Visit: Payer: Self-pay | Admitting: Family Medicine

## 2021-08-30 DIAGNOSIS — I10 Essential (primary) hypertension: Secondary | ICD-10-CM

## 2021-11-16 ENCOUNTER — Other Ambulatory Visit: Payer: Self-pay | Admitting: Family Medicine

## 2021-11-16 DIAGNOSIS — I10 Essential (primary) hypertension: Secondary | ICD-10-CM

## 2022-01-12 ENCOUNTER — Telehealth: Payer: Self-pay | Admitting: Family Medicine

## 2022-01-12 ENCOUNTER — Other Ambulatory Visit: Payer: Self-pay

## 2022-01-12 DIAGNOSIS — F32A Depression, unspecified: Secondary | ICD-10-CM

## 2022-01-12 DIAGNOSIS — F419 Anxiety disorder, unspecified: Secondary | ICD-10-CM

## 2022-01-12 MED ORDER — CITALOPRAM HYDROBROMIDE 20 MG PO TABS: ORAL_TABLET | ORAL | 0 refills | Status: DC

## 2022-01-12 NOTE — Telephone Encounter (Signed)
Converted to refill request 

## 2022-01-12 NOTE — Telephone Encounter (Signed)
[facility] pharmacy faxed refill request for the following medications:   citalopram (CELEXA) 20 MG tablet  Please advise

## 2022-02-10 ENCOUNTER — Other Ambulatory Visit: Payer: Self-pay | Admitting: Family Medicine

## 2022-02-10 DIAGNOSIS — I10 Essential (primary) hypertension: Secondary | ICD-10-CM

## 2022-03-07 ENCOUNTER — Telehealth: Payer: Self-pay

## 2022-03-07 NOTE — Telephone Encounter (Signed)
Copied from [place] [phone]. Topic: Referral - Question ?>> Mar 07, 2022 10:08 AM Pawlus, Brayton Layman A wrote: ?Reason for CRM: Pt called in wanting to know if she would need a referral sent in for psychiatry, pt needed assistance with finding a psychiatrist. ?

## 2022-03-08 ENCOUNTER — Other Ambulatory Visit: Payer: Self-pay | Admitting: Family Medicine

## 2022-03-08 DIAGNOSIS — I10 Essential (primary) hypertension: Secondary | ICD-10-CM

## 2022-03-13 ENCOUNTER — Encounter: Payer: Self-pay | Admitting: Physician Assistant

## 2022-03-13 ENCOUNTER — Ambulatory Visit: Payer: 59 | Admitting: Physician Assistant

## 2022-03-13 VITALS — BP 132/71 | HR 99 | Ht 61.5 in | Wt 163.7 lb

## 2022-03-13 DIAGNOSIS — E785 Hyperlipidemia, unspecified: Secondary | ICD-10-CM

## 2022-03-13 DIAGNOSIS — F331 Major depressive disorder, recurrent, moderate: Secondary | ICD-10-CM | POA: Insufficient documentation

## 2022-03-13 DIAGNOSIS — E1165 Type 2 diabetes mellitus with hyperglycemia: Secondary | ICD-10-CM | POA: Diagnosis not present

## 2022-03-13 DIAGNOSIS — E1169 Type 2 diabetes mellitus with other specified complication: Secondary | ICD-10-CM

## 2022-03-13 DIAGNOSIS — R232 Flushing: Secondary | ICD-10-CM

## 2022-03-13 DIAGNOSIS — M5030 Other cervical disc degeneration, unspecified cervical region: Secondary | ICD-10-CM

## 2022-03-13 DIAGNOSIS — I10 Essential (primary) hypertension: Secondary | ICD-10-CM

## 2022-03-13 DIAGNOSIS — F321 Major depressive disorder, single episode, moderate: Secondary | ICD-10-CM | POA: Diagnosis not present

## 2022-03-13 MED ORDER — LISINOPRIL 10 MG PO TABS: 10.0000 mg | ORAL_TABLET | Freq: Every day | ORAL | 1 refills | Status: DC

## 2022-03-13 MED ORDER — LORAZEPAM 0.5 MG PO TABS: 0.5000 mg | ORAL_TABLET | Freq: Every day | ORAL | 0 refills | Status: DC | PRN

## 2022-03-13 NOTE — Progress Notes (Signed)
? ?I,[name],acting as a Education administrator for [employer], PA-C.,have documented all relevant documentation on the behalf of [name], PA-C,as directed by  [name], PA-C while in the presence of [name], PA-C. ? ?Established Patient Office Visit ? ?Subjective:  ?Patient ID: Michelle Hendricks, female    DOB: 08-13-1966  Age: 56 y.o. MRN: [id number] ? ?CC: anxiety/depression f/u ? ?Mood swings, hot flashes ?-Pt attributes to menopause, LMP 10 years ago.  ?-Questions if estrogen is an option. ? ?Chronic neck pain ?-had previously been evaluated and recommended surgery. Requests second opinion ?-manages pain w/ 'herbal remedies' including marijuana.  ? ?HPI ?Michelle Hendricks presents for discuss referral to psychiatrist. ?Anxiety, Follow-up ? ? ?She was last seen for anxiety 8 months ago. ?Changes made at last visit include continue Celexa. ?  ?She reports good compliance with treatment. ?She reports good tolerance of treatment. ?She is not having side effects.  ?She feels her anxiety is between moderate and severe and Worse since last visit. ? ?Symptoms: ?No chest pain Yes difficulty concentrating  ?No dizziness Yes fatigue  ?No feelings of losing control Yes insomnia  ?Yes irritable No palpitations  ?Yes panic attacks Yes racing thoughts  ?No shortness of breath Yes sweating  ?   ?Anger, irritability. ?Reports pain attacks 1-2 times a week.  ? ?GAD-7 Results ? ?  03/13/2022  ?  3:50 PM 10/07/2020  ?  4:29 PM  ?GAD-7 Generalized Anxiety Disorder Screening Tool  ?1. Feeling Nervous, Anxious, or on Edge 2 1  ?2. Not Being Able to Stop or Control Worrying 2 1  ?3. Worrying Too Much About Different Things 2 0  ?4. Trouble Relaxing 3 1  ?5. Being So Restless it's Hard To Sit Still 1 1  ?6. Becoming Easily Annoyed or Irritable 3 1  ?7. Feeling Afraid As If Something Awful Might Happen 0 1  ?Total GAD-7 Score 13 6  ?Difficulty At Work, Home, or Getting  Along With Others? Very difficult Somewhat difficult  ? ? ?PHQ-9  Scores ? ?  03/13/2022  ?  3:49 PM 07/03/2021  ?  2:54 PM 10/07/2020  ?  4:08 PM  ?PHQ9 SCORE ONLY  ?PHQ-9 Total Score 12 11 11   ? ? ?---------------------------------------------------------------------------------------------------  ? ?Past Medical History:  ?Diagnosis Date  ? Anxiety   ? Depression   ? Diabetes mellitus without complication ([place])   ? Hypertension   ? ? ?Past Surgical History:  ?Procedure Laterality Date  ? NASAL SINUS SURGERY    ? ? ?Family History  ?Problem Relation Age of Onset  ? Alzheimer's disease Maternal Grandmother   ? Cancer Maternal Grandfather   ? Diabetes Maternal Grandfather   ? Heart attack Father   ? ? ?Social History  ? ?Socioeconomic History  ? Marital status: Single  ?  Spouse name: Not on file  ? Number of children: Not on file  ? Years of education: Not on file  ? Highest education level: Not on file  ?Occupational History  ? Not on file  ?Tobacco Use  ? Smoking status: Some Days  ?  Packs/day: 0.75  ?  Types: Cigarettes  ? Smokeless tobacco: Never  ?Substance and Sexual Activity  ? Alcohol use: No  ?  Alcohol/week: 0.0 standard drinks  ? Drug use: Not on file  ? Sexual activity: Not on file  ?Other Topics Concern  ? Not on file  ?Social History Narrative  ? Not on file  ? ?Social Determinants of Health  ? ?  Financial Resource Strain: Not on file  ?Food Insecurity: Not on file  ?Transportation Needs: Not on file  ?Physical Activity: Not on file  ?Stress: Not on file  ?Social Connections: Not on file  ?Intimate Partner Violence: Not on file  ? ? ?Outpatient Medications Prior to Visit  ?Medication Sig Dispense Refill  ? citalopram (CELEXA) 20 MG tablet TAKE 1.5 TABLET BY MOUTH DAILY AT BEDTIME 135 tablet 0  ? ibuprofen (ADVIL) 200 MG tablet Take by mouth.    ? simvastatin (ZOCOR) 20 MG tablet Take 1 tablet (20 mg total) by mouth at bedtime. 90 tablet 3  ? lisinopril (ZESTRIL) 10 MG tablet Take 1 tablet (10 mg total) by mouth daily. Needs to schedule O.V. to establish with [name] PA-C before any additional refills can be approved. 30 tablet 0  ? azithromycin (ZITHROMAX) 250 MG tablet TAKE 2 TABLETS BY MOUTH TODAY, THEN TAKE 1 TABLET DAILY FOR 4 DAYS (Patient not taking: Reported on 03/13/2022)    ? benzonatate (TESSALON) 200 MG capsule Take by mouth. (Patient not taking: Reported on 03/13/2022)    ? citalopram (CELEXA) 20 MG tablet 30 mg. (Patient not taking: Reported on 03/13/2022)    ? clindamycin (CLEOCIN) 300 MG capsule Take 300 mg by mouth every 8 (eight) hours. (Patient not taking: Reported on 03/13/2022)    ? fluconazole (DIFLUCAN) 150 MG tablet Take 150 mg by mouth once. (Patient not taking: Reported on 03/13/2022)    ? hydrocortisone cream 1 % Apply topically 2 (two) times daily. (Patient not taking: Reported on 03/13/2022)    ? insulin lispro (HUMALOG) 100 UNIT/ML KwikPen Take 2-12 units three times daily as directed to max dose 30 units per day (Patient not taking: Reported on 03/13/2022)    ? lisinopril (ZESTRIL) 5 MG tablet Take by mouth. (Patient not taking: Reported on 03/13/2022)    ? LORazepam (ATIVAN) 0.5 MG tablet Take 1 tablet (0.5 mg total) by mouth 2 (two) times daily. (Patient not taking: Reported on 03/13/2022) 30 tablet 0  ? LORazepam (ATIVAN) 0.5 MG tablet Take by mouth. (Patient not taking: Reported on 03/13/2022)    ? predniSONE (DELTASONE) 20 MG tablet Take 20 mg by mouth 2 (two) times daily. (Patient not taking: Reported on 03/13/2022)    ? ?No facility-administered medications prior to visit.  ? ? ?Allergies  ?Allergen Reactions  ? Penicillins Rash and Other (See Comments)  ?  Has patient had a PCN reaction causing immediate rash, facial/tongue/throat swelling, SOB or lightheadedness with hypotension: No ?Has patient had a PCN reaction causing severe rash involving mucus membranes or skin necrosis: No ?Has patient had a PCN reaction that required hospitalization No ?Has patient had a PCN reaction occurring within the last 10 years: No ?If all of the above answers  are "NO", then may proceed with Cephalosporin use.  ? ? ?ROS ?Review of Systems  ?Constitutional:  Negative for fatigue and fever.  ?Respiratory:  Negative for cough and shortness of breath.   ?Cardiovascular:  Negative for chest pain and leg swelling.  ?Gastrointestinal:  Negative for abdominal pain.  ?Neurological:  Negative for dizziness and headaches.  ?Psychiatric/Behavioral:  Positive for decreased concentration and sleep disturbance. The patient is nervous/anxious.   ? ?  ?Objective:  ?  ?Physical Exam ?Constitutional:   ?   Appearance: Normal appearance. She is not ill-appearing.  ?HENT:  ?   Head: Normocephalic.  ?Eyes:  ?   Conjunctiva/sclera: Conjunctivae normal.  ?Cardiovascular:  ?   Rate  and Rhythm: Normal rate and regular rhythm.  ?   Pulses: Normal pulses.  ?   Heart sounds: Normal heart sounds.  ?Pulmonary:  ?   Effort: Pulmonary effort is normal.  ?   Breath sounds: Normal breath sounds.  ?Musculoskeletal:  ?   Right lower leg: No edema.  ?   Left lower leg: No edema.  ?Neurological:  ?   Mental Status: She is oriented to person, place, and time.  ?Psychiatric:     ?   Mood and Affect: Mood normal.     ?   Behavior: Behavior normal.  ? ? ?BP 132/71   Pulse 99   Ht 5' 1.5" (1.562 m)   Wt 163 lb 11.2 oz (74.3 kg)   LMP 04/16/2015   SpO2 93%   BMI 30.43 kg/m?  ?Wt Readings from Last 3 Encounters:  ?03/13/22 163 lb 11.2 oz (74.3 kg)  ?07/03/21 163 lb (73.9 kg)  ?10/07/20 153 lb (69.4 kg)  ? ? ? ?Health Maintenance Due  ?Topic Date Due  ? Hepatitis C Screening  Never done  ? TETANUS/TDAP  Never done  ? PAP SMEAR-Modifier  Never done  ? COLONOSCOPY (Pts 45-19yrs Insurance coverage will need to be confirmed)  Never done  ? MAMMOGRAM  Never done  ? Zoster Vaccines- Shingrix (1 of 2) Never done  ? OPHTHALMOLOGY EXAM  11/06/2018  ? COVID-19 Vaccine (4 - Booster for Pfizer series) 12/24/2020  ? FOOT EXAM  10/07/2021  ? HEMOGLOBIN A1C  01/07/2022  ? ? ?There are no preventive care reminders to display  for this patient. ? ?Lab Results  ?Component Value Date  ? TSH 2.060 07/07/2021  ? ?Lab Results  ?Component Value Date  ? WBC 7.3 07/07/2021  ? HGB 14.7 07/07/2021  ? HCT 42.5 07/07/2021  ? MCV 88 07/07/2021  ? PLT

## 2022-03-13 NOTE — Assessment & Plan Note (Signed)
Pt attributes to menopause ?Will check tsh/t4 ?Had estrogen/HRT discussion, not currently a candidate as she smokes, risk of clots ?

## 2022-03-13 NOTE — Assessment & Plan Note (Signed)
Currently managing with simvastatin 20 mg.  ?Will recheck lipid panel fasting historically high trigs as well ?

## 2022-03-13 NOTE — Assessment & Plan Note (Addendum)
W/ panic attacks. Takes 30 mg Celexa daily.  ?Was taking lorazepam reportedly monthly for panic attacks. Panic attacks have increased. ?Ref to psychiatry. Pt would like to only see psychiatry for now , will hold off of ref for therapist.  ?Will send in #10 lorazepam 0.5 mg, will closely monitor use, until she sees psych ?

## 2022-03-13 NOTE — Assessment & Plan Note (Signed)
Unmedicated ?Advised pt get fasting labs done, fasting glucose, A1c.  ?F/u 6-8 weeks will need urine microalbumin/creat and foot exam. Does not follow with opthalmology ?

## 2022-03-13 NOTE — Assessment & Plan Note (Signed)
In range today, continue lisinopril 10 mg ?

## 2022-03-13 NOTE — Assessment & Plan Note (Signed)
Chronic, pt manages with 'herbal remedies' which includes marijuana. ?Requests referral to ortho ?

## 2022-03-24 ENCOUNTER — Telehealth (HOSPITAL_BASED_OUTPATIENT_CLINIC_OR_DEPARTMENT_OTHER): Payer: Self-pay | Admitting: Psychiatry

## 2022-03-24 ENCOUNTER — Encounter (HOSPITAL_COMMUNITY): Payer: Self-pay | Admitting: Psychiatry

## 2022-03-24 DIAGNOSIS — F321 Major depressive disorder, single episode, moderate: Secondary | ICD-10-CM

## 2022-03-24 DIAGNOSIS — F411 Generalized anxiety disorder: Secondary | ICD-10-CM

## 2022-03-24 DIAGNOSIS — R4589 Other symptoms and signs involving emotional state: Secondary | ICD-10-CM

## 2022-03-24 DIAGNOSIS — F419 Anxiety disorder, unspecified: Secondary | ICD-10-CM

## 2022-03-24 LAB — COMPREHENSIVE METABOLIC PANEL
ALT: 18 IU/L (ref 0–32)
AST: 18 IU/L (ref 0–40)
Albumin/Globulin Ratio: 1.6 (ref 1.2–2.2)
Albumin: 4.1 g/dL (ref 3.8–4.9)
Alkaline Phosphatase: 90 IU/L (ref 44–121)
BUN/Creatinine Ratio: 12 (ref 9–23)
BUN: 10 mg/dL (ref 6–24)
Bilirubin Total: 0.2 mg/dL (ref 0.0–1.2)
CO2: 21 mmol/L (ref 20–29)
Calcium: 9.2 mg/dL (ref 8.7–10.2)
Chloride: 102 mmol/L (ref 96–106)
Creatinine, Ser: 0.84 mg/dL (ref 0.57–1.00)
Globulin, Total: 2.6 g/dL (ref 1.5–4.5)
Glucose: 216 mg/dL — ABNORMAL HIGH (ref 70–99)
Potassium: 4.3 mmol/L (ref 3.5–5.2)
Sodium: 141 mmol/L (ref 134–144)
Total Protein: 6.7 g/dL (ref 6.0–8.5)
eGFR: 83 mL/min/{1.73_m2} (ref >59)

## 2022-03-24 LAB — TSH+FREE T4
Free T4: 1.05 ng/dL (ref 0.82–1.77)
TSH: 3.09 u[IU]/mL (ref 0.450–4.500)

## 2022-03-24 LAB — LIPID PANEL
Chol/HDL Ratio: 4.9 ratio — ABNORMAL HIGH (ref 0.0–4.4)
Cholesterol, Total: 181 mg/dL (ref 100–199)
HDL: 37 mg/dL — ABNORMAL LOW (ref >39)
LDL Chol Calc (NIH): 70 mg/dL (ref 0–99)
Triglycerides: 476 mg/dL — ABNORMAL HIGH (ref 0–149)
VLDL Cholesterol Cal: 74 mg/dL — ABNORMAL HIGH (ref 5–40)

## 2022-03-24 LAB — HEMOGLOBIN A1C
Est. average glucose Bld gHb Est-mCnc: 240 mg/dL
Hgb A1c MFr Bld: 10 % — ABNORMAL HIGH (ref 4.8–5.6)

## 2022-03-24 MED ORDER — LAMOTRIGINE 25 MG PO TABS: 25.0000 mg | ORAL_TABLET | Freq: Every day | ORAL | 0 refills | Status: DC
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
## 2022-03-26 ENCOUNTER — Other Ambulatory Visit: Payer: Self-pay | Admitting: Physician Assistant

## 2022-03-26 DIAGNOSIS — E1165 Type 2 diabetes mellitus with hyperglycemia: Secondary | ICD-10-CM

## 2022-03-26 MED ORDER — SITAGLIPTIN PHOSPHATE 100 MG PO TABS: 100.0000 mg | ORAL_TABLET | Freq: Every day | ORAL | 1 refills | Status: DC

## 2022-03-26 NOTE — Telephone Encounter (Signed)
januvia not covered by pts place, will send in aloglitpin as alternative ?

## 2022-04-10 ENCOUNTER — Other Ambulatory Visit: Payer: Self-pay | Admitting: Physician Assistant

## 2022-04-10 DIAGNOSIS — F32A Depression, unspecified: Secondary | ICD-10-CM

## 2022-04-10 DIAGNOSIS — F419 Anxiety disorder, unspecified: Secondary | ICD-10-CM

## 2022-04-15 ENCOUNTER — Other Ambulatory Visit (HOSPITAL_COMMUNITY): Payer: Self-pay | Admitting: Psychiatry

## 2022-04-24 ENCOUNTER — Encounter (HOSPITAL_COMMUNITY): Payer: Self-pay | Admitting: Psychiatry

## 2022-04-24 ENCOUNTER — Ambulatory Visit (INDEPENDENT_AMBULATORY_CARE_PROVIDER_SITE_OTHER): Payer: 59 | Admitting: Psychiatry

## 2022-04-24 VITALS — BP 118/78 | HR 90 | Temp 97.6°F | Ht 62.0 in | Wt 161.0 lb

## 2022-04-24 DIAGNOSIS — F32A Depression, unspecified: Secondary | ICD-10-CM

## 2022-04-24 DIAGNOSIS — F321 Major depressive disorder, single episode, moderate: Secondary | ICD-10-CM | POA: Diagnosis not present

## 2022-04-24 DIAGNOSIS — F411 Generalized anxiety disorder: Secondary | ICD-10-CM

## 2022-04-24 DIAGNOSIS — F419 Anxiety disorder, unspecified: Secondary | ICD-10-CM

## 2022-04-24 MED ORDER — LAMOTRIGINE 25 MG PO TABS: 25.0000 mg | ORAL_TABLET | Freq: Every day | ORAL | 0 refills | Status: DC

## 2022-04-24 MED ORDER — CITALOPRAM HYDROBROMIDE 40 MG PO TABS: ORAL_TABLET | ORAL | 0 refills | Status: DC
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
## 2022-04-24 NOTE — Progress Notes (Unsigned)
I,Michelle Hendricks,acting as a Education administrator for [employer], PA-C.,have documented all relevant documentation on the behalf of Michelle Kirschner, PA-C,as directed by  Michelle Kirschner, PA-C while in the presence of Michelle Kirschner, PA-C.   Established patient visit   Patient: Michelle Hendricks   DOB: 07-16-1966   56 y.o. Female  MRN: [id number] Visit Date: 04/25/2022  Today's healthcare provider: [name], PA-C   No chief complaint on file.  Subjective    HPI  Diabetes Mellitus Type II, follow-up  Lab Results  Component Value Date   HGBA1C 10.0 (H) 03/23/2022   HGBA1C 8.5 (H) 07/07/2021   HGBA1C 7.3 (A) 10/07/2020   Last seen for diabetes 8 weeks ago.  Management since then includes restarting januvia medication daily. Patient declined at the time She reports {excellent/good/fair/poor:19665} compliance with treatment. She {is/is not:21021397} having side effects. {document side effects if present:1}  Home blood sugar records: {diabetes glucometry results:16657}  Episodes of hypoglycemia? {Yes/No:20286} {enter details if yes:1}   Current insulin regiment: {***Type 'None' if not taking insulin                                                otherwise enter complete                                                 details of insulin regiment:1} Most Recent Eye Exam: ***  --------------------------------------------------------------------------------------------------- Hypertension, follow-up  BP Readings from Last 3 Encounters:  03/13/22 132/71  07/03/21 131/78  10/07/20 119/74   Wt Readings from Last 3 Encounters:  03/13/22 163 lb 11.2 oz (74.3 kg)  07/03/21 163 lb (73.9 kg)  10/07/20 153 lb (69.4 kg)     She was last seen for hypertension 8 weeks ago.  BP at that visit was 132/71. Management since that visit includes continue current treatment. She reports {excellent/good/fair/poor:19665} compliance with treatment. She {is/is not:9024} having side effects. {document  side effects if present:1} She {is/is not:9024} exercising. She {is/is not:9024} adherent to low salt diet.   Outside blood pressures are {enter patient reported home BP, or 'not being checked':1}.  She {does/does not:200015} smoke.  Use of agents associated with hypertension: {bp agents assoc with hypertension:511::"none"}.   --------------------------------------------------------------------------------------------------- Lipid/Cholesterol, follow-up  Last Lipid Panel: Lab Results  Component Value Date   CHOL 181 03/23/2022   LDLCALC 70 03/23/2022   HDL 37 (L) 03/23/2022   TRIG 476 (H) 03/23/2022    She was last seen for this 8 weeks ago.  Management since that visit includes continue current treatment.  She reports {excellent/good/fair/poor:19665} compliance with treatment. She {is/is not:9024} having side effects. {document side effects if present:1}  Symptoms: {Yes/No:20286} appetite changes {Yes/No:20286} foot ulcerations  {Yes/No:20286} chest pain {Yes/No:20286} chest pressure/discomfort  {Yes/No:20286} dyspnea {Yes/No:20286} orthopnea  {Yes/No:20286} fatigue {Yes/No:20286} lower extremity edema  {Yes/No:20286} palpitations {Yes/No:20286} paroxysmal nocturnal dyspnea  {Yes/No:20286} nausea {Yes/No:20286} numbness or tingling of extremity  {Yes/No:20286} polydipsia {Yes/No:20286} polyuria  {Yes/No:20286} speech difficulty {Yes/No:20286} syncope   She is following a {diet:21022986} diet. Current exercise: {exercise NHAFB:90383}  Last metabolic panel Lab Results  Component Value Date   GLUCOSE 216 (H) 03/23/2022   NA 141 03/23/2022   K 4.3 03/23/2022   BUN 10 03/23/2022  CREATININE 0.84 03/23/2022   EGFR 83 03/23/2022   GFRNONAA 100 11/02/2020   CALCIUM 9.2 03/23/2022   AST 18 03/23/2022   ALT 18 03/23/2022   The 10-year ASCVD risk score (Arnett DK, et al., 2019) is:  12.8%  ---------------------------------------------------------------------------------------------------   Medications: Outpatient Medications Prior to Visit  Medication Sig   Alogliptin Benzoate 25 MG TABS Take 25 mg by mouth daily. (Patient not taking: Reported on 04/24/2022)   citalopram (CELEXA) 40 MG tablet Dose increased to 27m. Has meds for now   ibuprofen (ADVIL) 200 MG tablet Take by mouth.   lamoTRIgine (LAMICTAL) 25 MG tablet Take 1 tablet (25 mg total) by mouth daily.   lisinopril (ZESTRIL) 10 MG tablet Take 1 tablet (10 mg total) by mouth daily.   LORazepam (ATIVAN) 0.5 MG tablet Take 1 tablet (0.5 mg total) by mouth daily as needed for anxiety (panic attack).   simvastatin (ZOCOR) 20 MG tablet Take 1 tablet (20 mg total) by mouth at bedtime.   No facility-administered medications prior to visit.    Review of Systems  {Labs  Heme  Chem  Endocrine  Serology  Results Review (optional):23779}   Objective    LMP 04/16/2015  {Show previous vital signs (optional):23777}  Physical Exam  ***  No results found for any visits on 04/25/22.  Assessment & Plan     ***  No follow-ups on file.      {provider attestation***:1}   LMikey Kirschner PA-C  BLou[facility] [phone](phone) [phone](fax)  [place]

## 2022-04-25 ENCOUNTER — Ambulatory Visit (INDEPENDENT_AMBULATORY_CARE_PROVIDER_SITE_OTHER): Payer: 59 | Admitting: Physician Assistant

## 2022-04-25 ENCOUNTER — Encounter: Payer: Self-pay | Admitting: Physician Assistant

## 2022-04-25 VITALS — BP 117/73 | HR 88 | Ht 62.0 in | Wt 162.7 lb

## 2022-04-25 DIAGNOSIS — E1165 Type 2 diabetes mellitus with hyperglycemia: Secondary | ICD-10-CM

## 2022-04-25 DIAGNOSIS — I10 Essential (primary) hypertension: Secondary | ICD-10-CM

## 2022-04-25 DIAGNOSIS — Z1231 Encounter for screening mammogram for malignant neoplasm of breast: Secondary | ICD-10-CM

## 2022-04-25 DIAGNOSIS — M5030 Other cervical disc degeneration, unspecified cervical region: Secondary | ICD-10-CM

## 2022-04-25 LAB — POCT GLYCOSYLATED HEMOGLOBIN (HGB A1C): Hemoglobin A1C: 8.9 % — AB (ref 4.0–5.6)

## 2022-04-25 MED ORDER — ALOGLIPTIN BENZOATE 25 MG PO TABS: 25.0000 mg | ORAL_TABLET | Freq: Every day | ORAL | 1 refills | Status: DC

## 2022-04-25 MED ORDER — MELOXICAM 15 MG PO TABS: 15.0000 mg | ORAL_TABLET | Freq: Every day | ORAL | 1 refills | Status: DC

## 2022-04-25 NOTE — Assessment & Plan Note (Addendum)
Pt never picked up aloglipitin. Resent to pharmacy. Open to trying. Discussed many other alternative medications.  A1c is improved, largest change was cutting out soft drinks.  Congratulated on effort Foot exam completed today uacr completed today F/u 80mo

## 2022-04-25 NOTE — Assessment & Plan Note (Signed)
Well controlled continue lisinopril 10 mg Last cmp reviewed

## 2022-04-25 NOTE — Assessment & Plan Note (Signed)
Pt taking 600-800 mg of ibuprofen daily ; advised switching to mobic 15 mg daily

## 2022-04-26 LAB — SPECIMEN STATUS REPORT

## 2022-04-26 LAB — MICROALBUMIN / CREATININE URINE RATIO
Creatinine, Urine: 61.3 mg/dL
Microalb/Creat Ratio: <5 mg/g creat (ref 0–29)
Microalbumin, Urine: <3 ug/mL

## 2022-05-15 ENCOUNTER — Other Ambulatory Visit (HOSPITAL_COMMUNITY): Payer: Self-pay

## 2022-05-15 MED ORDER — LAMOTRIGINE 25 MG PO TABS: 25.0000 mg | ORAL_TABLET | Freq: Every day | ORAL | 0 refills | Status: DC

## 2022-06-07 ENCOUNTER — Encounter (HOSPITAL_COMMUNITY): Payer: Self-pay | Admitting: Psychiatry

## 2022-06-07 ENCOUNTER — Ambulatory Visit (HOSPITAL_COMMUNITY): Payer: 59 | Admitting: Psychiatry

## 2022-06-07 VITALS — BP 118/78 | Temp 98.6°F | Ht 62.0 in | Wt 162.0 lb

## 2022-06-07 DIAGNOSIS — F321 Major depressive disorder, single episode, moderate: Secondary | ICD-10-CM

## 2022-06-07 DIAGNOSIS — F419 Anxiety disorder, unspecified: Secondary | ICD-10-CM

## 2022-06-07 DIAGNOSIS — F411 Generalized anxiety disorder: Secondary | ICD-10-CM | POA: Diagnosis not present

## 2022-06-07 DIAGNOSIS — F32A Depression, unspecified: Secondary | ICD-10-CM
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
## 2022-06-15 ENCOUNTER — Other Ambulatory Visit (HOSPITAL_COMMUNITY): Payer: Self-pay | Admitting: Psychiatry

## 2022-07-08 ENCOUNTER — Other Ambulatory Visit: Payer: Self-pay

## 2022-07-08 ENCOUNTER — Emergency Department: Payer: 59

## 2022-07-08 ENCOUNTER — Observation Stay
Admission: EM | Admit: 2022-07-08 | Discharge: 2022-07-09 | Disposition: A | Discharge status: Home / Self Care | Payer: 59 | Attending: Internal Medicine | Admitting: Internal Medicine

## 2022-07-08 ENCOUNTER — Encounter: Payer: Self-pay | Admitting: Emergency Medicine

## 2022-07-08 DIAGNOSIS — E1159 Type 2 diabetes mellitus with other circulatory complications: Secondary | ICD-10-CM | POA: Diagnosis not present

## 2022-07-08 DIAGNOSIS — A419 Sepsis, unspecified organism: Secondary | ICD-10-CM | POA: Diagnosis not present

## 2022-07-08 DIAGNOSIS — E1169 Type 2 diabetes mellitus with other specified complication: Secondary | ICD-10-CM | POA: Diagnosis present

## 2022-07-08 DIAGNOSIS — E1110 Type 2 diabetes mellitus with ketoacidosis without coma: Secondary | ICD-10-CM | POA: Diagnosis not present

## 2022-07-08 DIAGNOSIS — R739 Hyperglycemia, unspecified: Secondary | ICD-10-CM

## 2022-07-08 DIAGNOSIS — E872 Acidosis, unspecified: Secondary | ICD-10-CM | POA: Diagnosis present

## 2022-07-08 DIAGNOSIS — R112 Nausea with vomiting, unspecified: Principal | ICD-10-CM | POA: Diagnosis present

## 2022-07-08 DIAGNOSIS — Z720 Tobacco use: Secondary | ICD-10-CM | POA: Diagnosis present

## 2022-07-08 DIAGNOSIS — R109 Unspecified abdominal pain: Secondary | ICD-10-CM | POA: Diagnosis present

## 2022-07-08 DIAGNOSIS — F329 Major depressive disorder, single episode, unspecified: Secondary | ICD-10-CM | POA: Diagnosis not present

## 2022-07-08 DIAGNOSIS — R1114 Bilious vomiting: Secondary | ICD-10-CM | POA: Diagnosis not present

## 2022-07-08 DIAGNOSIS — I10 Essential (primary) hypertension: Secondary | ICD-10-CM | POA: Diagnosis present

## 2022-07-08 DIAGNOSIS — F32A Depression, unspecified: Secondary | ICD-10-CM | POA: Diagnosis present

## 2022-07-08 DIAGNOSIS — E1165 Type 2 diabetes mellitus with hyperglycemia: Secondary | ICD-10-CM | POA: Diagnosis not present

## 2022-07-08 DIAGNOSIS — F17210 Nicotine dependence, cigarettes, uncomplicated: Secondary | ICD-10-CM | POA: Diagnosis not present

## 2022-07-08 LAB — WET PREP, GENITAL
Clue Cells Wet Prep HPF POC: NONE SEEN
Sperm: NONE SEEN
Trich, Wet Prep: NONE SEEN
WBC, Wet Prep HPF POC: <10 (ref <10)
Yeast Wet Prep HPF POC: NONE SEEN

## 2022-07-08 LAB — COMPREHENSIVE METABOLIC PANEL
ALT: 23 U/L (ref 0–44)
AST: 29 U/L (ref 15–41)
Albumin: 4.5 g/dL (ref 3.5–5.0)
Alkaline Phosphatase: 82 U/L (ref 38–126)
Anion gap: 15 (ref 5–15)
BUN: 16 mg/dL (ref 6–20)
CO2: 19 mmol/L — ABNORMAL LOW (ref 22–32)
Calcium: 9.8 mg/dL (ref 8.9–10.3)
Chloride: 101 mmol/L (ref 98–111)
Creatinine, Ser: 0.85 mg/dL (ref 0.44–1.00)
GFR, Estimated: >60 mL/min (ref >60)
Glucose, Bld: 363 mg/dL — ABNORMAL HIGH (ref 70–99)
Potassium: 4.7 mmol/L (ref 3.5–5.1)
Sodium: 135 mmol/L (ref 135–145)
Total Bilirubin: 1 mg/dL (ref 0.3–1.2)
Total Protein: 8 g/dL (ref 6.5–8.1)

## 2022-07-08 LAB — URINALYSIS, ROUTINE W REFLEX MICROSCOPIC
Bacteria, UA: NONE SEEN
Bilirubin Urine: NEGATIVE
Glucose, UA: >=500 mg/dL — AB
Hgb urine dipstick: NEGATIVE
Ketones, ur: 80 mg/dL — AB
Leukocytes,Ua: NEGATIVE
Nitrite: NEGATIVE
Protein, ur: NEGATIVE mg/dL
Specific Gravity, Urine: 1.044 — ABNORMAL HIGH (ref 1.005–1.030)
pH: 7 (ref 5.0–8.0)

## 2022-07-08 LAB — BASIC METABOLIC PANEL
Anion gap: 10 (ref 5–15)
BUN: 17 mg/dL (ref 6–20)
CO2: 21 mmol/L — ABNORMAL LOW (ref 22–32)
Calcium: 8.5 mg/dL — ABNORMAL LOW (ref 8.9–10.3)
Chloride: 108 mmol/L (ref 98–111)
Creatinine, Ser: 0.83 mg/dL (ref 0.44–1.00)
GFR, Estimated: >60 mL/min (ref >60)
Glucose, Bld: 242 mg/dL — ABNORMAL HIGH (ref 70–99)
Potassium: 4.1 mmol/L (ref 3.5–5.1)
Sodium: 139 mmol/L (ref 135–145)

## 2022-07-08 LAB — LACTIC ACID, PLASMA
Lactic Acid, Venous: 3.9 mmol/L (ref 0.5–1.9)
Lactic Acid, Venous: 3.8 mmol/L (ref 0.5–1.9)

## 2022-07-08 LAB — LIPASE, BLOOD: Lipase: 30 U/L (ref 11–51)

## 2022-07-08 LAB — CBC
HCT: 47.5 % — ABNORMAL HIGH (ref 36.0–46.0)
Hemoglobin: 16.5 g/dL — ABNORMAL HIGH (ref 12.0–15.0)
MCH: 30.1 pg (ref 26.0–34.0)
MCHC: 34.7 g/dL (ref 30.0–36.0)
MCV: 86.5 fL (ref 80.0–100.0)
Platelets: 322 10*3/uL (ref 150–400)
RBC: 5.49 MIL/uL — ABNORMAL HIGH (ref 3.87–5.11)
RDW: 11.9 % (ref 11.5–15.5)
WBC: 17 10*3/uL — ABNORMAL HIGH (ref 4.0–10.5)
nRBC: 0 % (ref 0.0–0.2)

## 2022-07-08 LAB — TROPONIN I (HIGH SENSITIVITY): Troponin I (High Sensitivity): 5 ng/L (ref <18)

## 2022-07-08 LAB — GLUCOSE, CAPILLARY
Glucose-Capillary: 201 mg/dL — ABNORMAL HIGH (ref 70–99)
Glucose-Capillary: 239 mg/dL — ABNORMAL HIGH (ref 70–99)

## 2022-07-08 LAB — BETA-HYDROXYBUTYRIC ACID
Beta-Hydroxybutyric Acid: 1.8 mmol/L — ABNORMAL HIGH (ref 0.05–0.27)
Beta-Hydroxybutyric Acid: 0.09 mmol/L (ref 0.05–0.27)

## 2022-07-08 MED ORDER — LACTATED RINGERS IV BOLUS (SEPSIS)
1000.0000 mL | Freq: Once | INTRAVENOUS | Status: AC
  Administered 2022-07-08: 1000 mL via INTRAVENOUS

## 2022-07-08 MED ORDER — LACTATED RINGERS IV SOLN: INTRAVENOUS | Status: DC

## 2022-07-08 MED ORDER — IOHEXOL 350 MG/ML SOLN
100.0000 mL | Freq: Once | INTRAVENOUS | Status: AC | PRN
  Administered 2022-07-08: 100 mL via INTRAVENOUS

## 2022-07-08 MED ORDER — DEXTROSE IN LACTATED RINGERS 5 % IV SOLN: INTRAVENOUS | Status: DC

## 2022-07-08 MED ORDER — BISACODYL 5 MG PO TBEC: 5.0000 mg | DELAYED_RELEASE_TABLET | Freq: Every day | ORAL | Status: DC | PRN

## 2022-07-08 MED ORDER — LACTATED RINGERS IV SOLN
INTRAVENOUS | Status: DC
  Administered 2022-07-09: 1000 mL via INTRAVENOUS

## 2022-07-08 MED ORDER — METRONIDAZOLE 500 MG/100ML IV SOLN
500.0000 mg | Freq: Two times a day (BID) | INTRAVENOUS | Status: DC
  Administered 2022-07-09: 500 mg via INTRAVENOUS
  Filled 2022-07-08 (×2): qty 100

## 2022-07-08 MED ORDER — SODIUM CHLORIDE 0.9 % IV BOLUS
500.0000 mL | Freq: Once | INTRAVENOUS | Status: AC
  Administered 2022-07-08: 500 mL via INTRAVENOUS

## 2022-07-08 MED ORDER — ONDANSETRON HCL 4 MG PO TABS: 4.0000 mg | ORAL_TABLET | Freq: Four times a day (QID) | ORAL | Status: DC | PRN

## 2022-07-08 MED ORDER — DOCUSATE SODIUM 100 MG PO CAPS
100.0000 mg | ORAL_CAPSULE | Freq: Two times a day (BID) | ORAL | Status: DC
  Filled 2022-07-08 (×2): qty 1

## 2022-07-08 MED ORDER — INSULIN REGULAR(HUMAN) IN NACL 100-0.9 UT/100ML-% IV SOLN
INTRAVENOUS | Status: DC
  Administered 2022-07-08: 4.8 [IU]/h via INTRAVENOUS
  Filled 2022-07-08: qty 100

## 2022-07-08 MED ORDER — INSULIN ASPART 100 UNIT/ML IJ SOLN: 0.0000 [IU] | Freq: Three times a day (TID) | INTRAMUSCULAR | Status: DC

## 2022-07-08 MED ORDER — DEXTROSE 50 % IV SOLN: 0.0000 mL | INTRAVENOUS | Status: DC | PRN

## 2022-07-08 MED ORDER — POLYETHYLENE GLYCOL 3350 17 G PO PACK: 17.0000 g | PACK | Freq: Every day | ORAL | Status: DC | PRN

## 2022-07-08 MED ORDER — ONDANSETRON 4 MG PO TBDP: 4.0000 mg | ORAL_TABLET | Freq: Once | ORAL | Status: AC

## 2022-07-08 MED ORDER — LISINOPRIL 10 MG PO TABS: 10.0000 mg | ORAL_TABLET | Freq: Every day | ORAL | Status: DC

## 2022-07-08 MED ORDER — ACETAMINOPHEN 650 MG RE SUPP: 650.0000 mg | Freq: Four times a day (QID) | RECTAL | Status: DC | PRN

## 2022-07-08 MED ORDER — ONDANSETRON 4 MG PO TBDP
ORAL_TABLET | ORAL | Status: AC
  Administered 2022-07-08: 4 mg via ORAL
  Filled 2022-07-08: qty 1

## 2022-07-08 MED ORDER — SODIUM CHLORIDE 0.9 % IV BOLUS
1000.0000 mL | Freq: Once | INTRAVENOUS | Status: AC
  Administered 2022-07-08: 1000 mL via INTRAVENOUS

## 2022-07-08 MED ORDER — LORAZEPAM 0.5 MG PO TABS
0.5000 mg | ORAL_TABLET | Freq: Every day | ORAL | Status: DC | PRN
Start: 2022-07-08 — End: 2022-07-09

## 2022-07-08 MED ORDER — POTASSIUM CHLORIDE 10 MEQ/100ML IV SOLN
10.0000 meq | INTRAVENOUS | Status: DC
  Filled 2022-07-08 (×4): qty 100

## 2022-07-08 MED ORDER — SIMVASTATIN 20 MG PO TABS
20.0000 mg | ORAL_TABLET | Freq: Every day | ORAL | Status: DC
  Administered 2022-07-09: 20 mg via ORAL
  Filled 2022-07-08: qty 1

## 2022-07-08 MED ORDER — ONDANSETRON HCL 4 MG/2ML IJ SOLN
4.0000 mg | Freq: Once | INTRAMUSCULAR | Status: AC
  Administered 2022-07-08: 4 mg via INTRAVENOUS
  Filled 2022-07-08: qty 2

## 2022-07-08 MED ORDER — ONDANSETRON HCL 4 MG/2ML IJ SOLN: 4.0000 mg | Freq: Four times a day (QID) | INTRAMUSCULAR | Status: DC | PRN

## 2022-07-08 MED ORDER — CITALOPRAM HYDROBROMIDE 20 MG PO TABS
40.0000 mg | ORAL_TABLET | Freq: Every day | ORAL | Status: DC
  Administered 2022-07-09 (×2): 40 mg via ORAL
  Filled 2022-07-08 (×2): qty 2

## 2022-07-08 MED ORDER — HEPARIN SODIUM (PORCINE) 5000 UNIT/ML IJ SOLN
5000.0000 [IU] | Freq: Three times a day (TID) | INTRAMUSCULAR | Status: DC
  Administered 2022-07-09 (×2): 5000 [IU] via SUBCUTANEOUS
  Filled 2022-07-08 (×2): qty 1

## 2022-07-08 MED ORDER — LAMOTRIGINE 25 MG PO TABS
25.0000 mg | ORAL_TABLET | Freq: Every day | ORAL | Status: DC
  Administered 2022-07-09 (×2): 25 mg via ORAL
  Filled 2022-07-08 (×2): qty 1

## 2022-07-08 MED ORDER — LACTATED RINGERS IV BOLUS
1000.0000 mL | Freq: Once | INTRAVENOUS | Status: AC
  Administered 2022-07-08: 1000 mL via INTRAVENOUS

## 2022-07-08 MED ORDER — MORPHINE SULFATE (PF) 4 MG/ML IV SOLN
4.0000 mg | Freq: Once | INTRAVENOUS | Status: AC
  Administered 2022-07-08: 4 mg via INTRAVENOUS
  Filled 2022-07-08: qty 1

## 2022-07-08 MED ORDER — METRONIDAZOLE 500 MG/100ML IV SOLN
500.0000 mg | Freq: Once | INTRAVENOUS | Status: AC
  Administered 2022-07-08: 500 mg via INTRAVENOUS
  Filled 2022-07-08: qty 100

## 2022-07-08 MED ORDER — CEFEPIME HCL 2 G IV SOLR
2.0000 g | Freq: Three times a day (TID) | INTRAVENOUS | Status: DC
  Administered 2022-07-08 – 2022-07-09 (×2): 2 g via INTRAVENOUS
  Filled 2022-07-08 (×4): qty 12.5

## 2022-07-08 MED ORDER — CEFEPIME HCL 2 G IV SOLR
2.0000 g | Freq: Once | INTRAVENOUS | Status: DC
Start: 2022-07-08 — End: 2022-07-08

## 2022-07-08 MED ORDER — ACETAMINOPHEN 325 MG PO TABS: 650.0000 mg | ORAL_TABLET | Freq: Four times a day (QID) | ORAL | Status: DC | PRN

## 2022-07-08 NOTE — Assessment & Plan Note (Signed)
D/d include PUD/Gastritis related. Pt takes meloxicam.   Supportive care with IVF and antiemetics.

## 2022-07-08 NOTE — Assessment & Plan Note (Addendum)
Elevated BHA in setting of hyperglycemia will start DKA protocol.    Latest Ref Rng & Units 07/08/2022    4:14 PM 03/23/2022    7:08 AM 07/07/2021    7:42 AM  CMP  Glucose 70 - 99 mg/dL 110  315  945   BUN 6 - 20 mg/dL 16  10  15    Creatinine 0.44 - 1.00 mg/dL  8.59  2.92   Sodium 135 - 145 mmol/L 135  141  138   Potassium 3.5 - 5.1 mmol/L 4.7  4.3  5.0   Chloride 98 - 111 mmol/L 101  102  99   CO2 22 - 32 mmol/L 19  21  22    Calcium 8.9 - 10.3 mg/dL 9.8  9.2  4.46   Total Protein 6.5 - 8.1 g/dL 8.0  6.7  6.7   Total Bilirubin 0.3 - 1.2 mg/dL 1.0  0.2  0.3   Alkaline Phos 38 - 126 U/L 82  90  113   AST 15 - 41 U/L 29  18  22    ALT 0 - 44 U/L 23  18  22

## 2022-07-08 NOTE — ED Triage Notes (Signed)
Pt via POV from home c/o vomiting and abd pain since this AM. Denies diarrhea. Pt is A&OX4 and NAD

## 2022-07-08 NOTE — Assessment & Plan Note (Signed)
Pt takes simvastatin and Lipids not at goal . D/w pt about f/u with pcp for change in therapy.

## 2022-07-08 NOTE — Assessment & Plan Note (Addendum)
Vitals:   07/08/22 1615 07/08/22 1723 07/08/22 2137  BP: (!) 144/80 (!) 132/91 107/62  Cont lisinopril and PRN hydralazine if needed.  Continue with aggressive IVF hydration and hold lisinopril for tonight.

## 2022-07-08 NOTE — ED Provider Notes (Signed)
[facility] Provider Note    Event Date/Time   First MD Initiated Contact with Patient 07/08/22 1645     (approximate)   History   Abdominal Pain and Emesis   HPI  Michelle Hendricks is a 56 y.o. female here with abdominal pain and nausea with vomiting.  The patient states she woke up this morning and generally felt okay.  No significant symptoms last night.  She did eat to [employer] tac[employer] at around 9 PM.  Since waking up this morning, she has had persistently worsening epigastric and diffuse abdominal pain with cramping.  She has developed nausea and intractable vomiting.  No blood in her vomiting.  No melena.  No hematochezia.  Symptoms have persisted throughout the day.  No history of similar issues.  No known history of gallstones.  She has never had surgeries on her abdomen.  No sick contacts.  No recent travel.  Symptoms are worse with any attempted eating or drinking and she has been unable to keep anything down throughout the day today.  No alleviating factors.     Physical Exam   Triage Vital Signs: ED Triage Vitals  Enc Vitals Group     BP 07/08/22 1615 (!) 144/80     Pulse Rate 07/08/22 1615 80     Resp 07/08/22 1615 20     Temp 07/08/22 1615 98.3 F (36.8 C)     Temp Source 07/08/22 1615 Oral     SpO2 07/08/22 1615 100 %     Weight 07/08/22 1612 160 lb (72.6 kg)     Height 07/08/22 1612 5\' 2"  (1.575 m)     Head Circumference --      Peak Flow --      Pain Score 07/08/22 1612 8     Pain Loc --      Pain Edu? --      Excl. in GC? --     Most recent vital signs: Vitals:   07/08/22 1615 07/08/22 1723  BP: (!) 144/80 (!) 132/91  Pulse: 80 79  Resp: 20 18  Temp: 98.3 F (36.8 C) (!) 97.4 F (36.3 C)  SpO2: 100% 100%     General: Awake, no distress but does appear uncomfortable. CV:  Good peripheral perfusion.  Regular rate and rhythm. Resp:  Normal effort.  Lungs are clear to auscultation bilaterally. Abd:  No distention.  Moderate  epigastric tenderness to palpation.  No rebound. Other:  No lower extremity edema.  Mildly dry mucous membranes.   ED Results / Procedures / Treatments   Labs (all labs ordered are listed, but only abnormal results are displayed) Labs Reviewed  COMPREHENSIVE METABOLIC PANEL - Abnormal; Notable for the following components:      Result Value   CO2 19 (*)    Glucose, Bld 363 (*)    All other components within normal limits  CBC - Abnormal; Notable for the following components:   WBC 17.0 (*)    RBC 5.49 (*)    Hemoglobin 16.5 (*)    HCT 47.5 (*)    All other components within normal limits  URINALYSIS, ROUTINE W REFLEX MICROSCOPIC - Abnormal; Notable for the following components:   Color, Urine STRAW (*)    APPearance CLEAR (*)    Specific Gravity, Urine 1.044 (*)    Glucose, UA >=500 (*)    Ketones, ur 80 (*)    All other components within normal limits  LACTIC ACID, PLASMA - Abnormal;  Notable for the following components:   Lactic Acid, Venous 3.9 (*)    All other components within normal limits  CULTURE, BLOOD (SINGLE)  CULTURE, BLOOD (SINGLE)  WET PREP, GENITAL  LIPASE, BLOOD  LACTIC ACID, PLASMA  BETA-HYDROXYBUTYRIC ACID  TROPONIN I (HIGH SENSITIVITY)     EKG    RADIOLOGY CT angio abdomen/pelvis: No acute large vessel occlusion, no other significant intra-abdominal emergency, hepatic steatosis with possible cirrhosis noted, right cystic ovarian lesion   I also independently reviewed and agree with radiologist interpretations.   PROCEDURES:  Critical Care performed: Yes, see critical care procedure note(s)  .Critical Care  Performed by: Shaune Pollack, MD Authorized by: Shaune Pollack, MD   Critical care provider statement:    Critical care time (minutes):  30   Critical care time was exclusive of:  Separately billable procedures and treating other patients   Critical care was necessary to treat or prevent imminent or life-threatening deterioration of  the following conditions:  Cardiac failure, circulatory failure, respiratory failure and sepsis   Critical care was time spent personally by me on the following activities:  Development of treatment plan with patient or surrogate, discussions with consultants, evaluation of patient's response to treatment, examination of patient, ordering and review of laboratory studies, ordering and review of radiographic studies, ordering and performing treatments and interventions, pulse oximetry, re-evaluation of patient's condition and review of old charts     MEDICATIONS ORDERED IN ED: Medications  sodium chloride 0.9 % bolus 500 mL (has no administration in time range)  metroNIDAZOLE (FLAGYL) IVPB 500 mg (500 mg Intravenous New Bag/Given 07/08/22 1900)  ceFEPIme (MAXIPIME) 2 g in sodium chloride 0.9 % 100 mL IVPB (has no administration in time range)  ondansetron (ZOFRAN-ODT) disintegrating tablet 4 mg (4 mg Oral Given 07/08/22 1625)  lactated ringers bolus 1,000 mL (1,000 mLs Intravenous New Bag/Given 07/08/22 1718)  ondansetron (ZOFRAN) injection 4 mg (4 mg Intravenous Given 07/08/22 1718)  morphine (PF) 4 MG/ML injection 4 mg (4 mg Intravenous Given 07/08/22 1718)  sodium chloride 0.9 % bolus 1,000 mL (1,000 mLs Intravenous New Bag/Given 07/08/22 1850)  iohexol (OMNIPAQUE) 350 MG/ML injection 100 mL (100 mLs Intravenous Contrast Given 07/08/22 1808)     IMPRESSION / MDM / ASSESSMENT AND PLAN / ED COURSE  I reviewed the triage vital signs and the nursing notes.                               The patient is on the cardiac monitor to evaluate for evidence of arrhythmia and/or significant heart rate changes.   Ddx:  Differential includes the following, with pertinent life- or limb-threatening emergencies considered:  Pancreatitis, gastritis, PUD, food-borne illness, viral illness, obstruction, enteritis, diverticulitis, atypical ACS.  Patient's presentation is most consistent with acute presentation with  potential threat to life or bodily function.  MDM:  56 yo F here with abdominal pain, nausea, vomiting. Pt appears to feel unwell on arrival, but VSS. Broad labs sent and remarkable for significant leukocytosis of of 17,000.  CMP shows hyperglycemia, bicarb 19, but anion gap of 15.  Lactic acid sent and is 3.9 which likely explains her borderline anion gap.  Doubt DKA.  That being said, she does have ketonuria although again I suspect this is from vomiting overnight.  Patient given IV fluids and will add on a beta hydroxybutyric acid to see if patient is in DKA.  Otherwise, CT angio obtained given  her significant lactic acid elevation out of proportion to her exam.  She has no evidence of ischemia or other intra-abdominal emergency.  She does have incidental note of cirrhosis, which could explain her slightly disproportional lactic acid elevation.  However, will continue empiric treatment for possible sepsis with broad-spectrum antibiotics as well as IV fluids.  Will admit to medicine for observation.  Patient updated and is in agreement this plan.   MEDICATIONS GIVEN IN ED: Medications  sodium chloride 0.9 % bolus 500 mL (has no administration in time range)  metroNIDAZOLE (FLAGYL) IVPB 500 mg (500 mg Intravenous New Bag/Given 07/08/22 1900)  ceFEPIme (MAXIPIME) 2 g in sodium chloride 0.9 % 100 mL IVPB (has no administration in time range)  ondansetron (ZOFRAN-ODT) disintegrating tablet 4 mg (4 mg Oral Given 07/08/22 1625)  lactated ringers bolus 1,000 mL (1,000 mLs Intravenous New Bag/Given 07/08/22 1718)  ondansetron (ZOFRAN) injection 4 mg (4 mg Intravenous Given 07/08/22 1718)  morphine (PF) 4 MG/ML injection 4 mg (4 mg Intravenous Given 07/08/22 1718)  sodium chloride 0.9 % bolus 1,000 mL (1,000 mLs Intravenous New Bag/Given 07/08/22 1850)  iohexol (OMNIPAQUE) 350 MG/ML injection 100 mL (100 mLs Intravenous Contrast Given 07/08/22 1808)     Consults:  Hospitalist   EMR reviewed  Reviewed OB note,  wet prep which was mostly unremarkable on recent visit.    FINAL CLINICAL IMPRESSION(S) / ED DIAGNOSES   Final diagnoses:  Sepsis without acute organ dysfunction, due to unspecified organism (HCC)  Lactic acidosis  Hyperglycemia     Rx / DC Orders   ED Discharge Orders     None        Note:  This document was prepared using Dragon voice recognition software and may include unintentional dictation errors.   Shaune Pollack, MD 07/08/22 [phone]

## 2022-07-08 NOTE — Assessment & Plan Note (Addendum)
Epigastric abd pain. Cirrhosis on ct.  IV PPI.  PUD related.  PRN pain meds.  ADVISED PT TO REFRAIN FROM USING ANY NSAIDS.  Labs resulted showing pt is in DKA and attribute abd pain n/v/ to DKA and lactic acidosis.

## 2022-07-08 NOTE — Sepsis Progress Note (Signed)
Elink following code sepsis °

## 2022-07-08 NOTE — H&P (Addendum)
History and Physical    Michelle Hendricks [id number] DOB: 09-21-1966 DOA: 07/08/2022  PCP: [place]    Patient coming from:  Home    Chief Complaint:  N/V/Abdominal pain.   HPI:  Michelle Hendricks is a 56 y.o. female seen in ed with complaints of  N/V/Abd pain.  Duration: Today am   Frequency: Continuous   Location: epigastric abd pain.   Quality: Dull/ Ache.   Rate: 10/10  Radiation: NR   Aggravating: N/A  Alleviating: N/A/ Advil ( spinal DDD )  Associated factors: Nausea/vomiting.   Pt has past medical history of DM II, Tobacco abuse, HTN, DDD of of cervical spine.   ED Course:   Vitals:   07/08/22 1723 07/08/22 2137 07/09/22 0000 07/09/22 0100  BP: (!) 132/91 107/62 (!) 118/55 117/60  Pulse: 79 95 87 88  Resp: 18 20 15 18   Temp: (!) 97.4 F (36.3 C) 98.4 F (36.9 C) 98.2 F (36.8 C)   TempSrc: Oral Oral Oral   SpO2: 100% 95% 100% 100%  Weight:  73.2 kg    Height:      Pt in ed is alert, hypotensive.   Intake/Output Summary (Last 24 hours) at 07/09/2022 0125 Last data filed at 07/08/2022 2115 Gross per 24 hour  Intake 2200 ml  Output --  Net 2200 ml    ceFEPime (MAXIPIME) IV Stopped (07/08/22 2115)   dextrose 5% lactated ringers Stopped (07/09/22 0031)   lactated ringers 500 mL (07/09/22 0037)   lactated ringers 150 mL/hr at 07/09/22 0036   metronidazole     Review of Systems:  Review of Systems  Gastrointestinal:  Positive for abdominal pain, nausea and vomiting.  All other systems reviewed and are negative.  Past Medical History:  Diagnosis Date   Anxiety    Depression    Diabetes mellitus without complication ([place])    Hypertension     Past Surgical History:  Procedure Laterality Date   NASAL SINUS SURGERY       reports that she has been smoking cigarettes. She has been smoking an average of .5 packs per day. She has never used smokeless tobacco. She reports that she does not drink alcohol and does not use  drugs.  Allergies  Allergen Reactions   Penicillins Rash and Other (See Comments)    Has patient had a PCN reaction causing immediate rash, facial/tongue/throat swelling, SOB or lightheadedness with hypotension: No Has patient had a PCN reaction causing severe rash involving mucus membranes or skin necrosis: No Has patient had a PCN reaction that required hospitalization No Has patient had a PCN reaction occurring within the last 10 years: No If all of the above answers are "NO", then may proceed with Cephalosporin use.    Family History  Problem Relation Age of Onset   Alzheimer's disease Maternal Grandmother    Cancer Maternal Grandfather    Diabetes Maternal Grandfather    Heart attack Father     Prior to Admission medications   Medication Sig Start Date End Date Taking? Authorizing Provider  Alogliptin Benzoate 25 MG TABS Take 25 mg by mouth daily. 04/25/22   [name], PA-C  citalopram (CELEXA) 40 MG tablet Dose increased to 40mg . Has meds for now 04/24/22   Merian Capron, MD  lamoTRIgine (LAMICTAL) 25 MG tablet TAKE 1 TABLET (25 MG TOTAL) BY MOUTH DAILY. 06/18/22   Merian Capron, MD  lisinopril (ZESTRIL) 10 MG tablet Take 1 tablet (10 mg total) by mouth daily.  03/13/22   Drubel, Lillia Abed, PA-C  LORazepam (ATIVAN) 0.5 MG tablet Take 1 tablet (0.5 mg total) by mouth daily as needed for anxiety (panic attack). 03/13/22   Alfredia Ferguson, PA-C  meloxicam (MOBIC) 15 MG tablet Take 1 tablet (15 mg total) by mouth daily. 04/25/22   Alfredia Ferguson, PA-C  simvastatin (ZOCOR) 20 MG tablet Take 1 tablet (20 mg total) by mouth at bedtime. 07/12/21   Chrismon, Jodell Cipro, PA-C    Physical Exam: Vitals:   07/08/22 1723 07/08/22 2137 07/09/22 0000 07/09/22 0100  BP: (!) 132/91 107/62 (!) 118/55 117/60  Pulse: 79 95 87 88  Resp: 18 20 15 18   Temp: (!) 97.4 F (36.3 C) 98.4 F (36.9 C) 98.2 F (36.8 C)   TempSrc: Oral Oral Oral   SpO2: 100% 95% 100% 100%  Weight:  73.2 kg    Height:        Physical Exam Vitals and nursing note reviewed.  Constitutional:      General: She is not in acute distress.    Appearance: Normal appearance. She is not ill-appearing, toxic-appearing or diaphoretic.  HENT:     Head: Normocephalic and atraumatic.     Right Ear: Hearing and external ear normal.     Left Ear: Hearing and external ear normal.     Nose: Nose normal. No nasal deformity.     Mouth/Throat:     Lips: Pink.     Mouth: Mucous membranes are moist.     Tongue: No lesions.     Pharynx: Oropharynx is clear.  Eyes:     Extraocular Movements: Extraocular movements intact.  Neck:     Vascular: No carotid bruit.  Cardiovascular:     Rate and Rhythm: Normal rate and regular rhythm.     Pulses: Normal pulses.     Heart sounds: Normal heart sounds.  Pulmonary:     Effort: Pulmonary effort is normal.     Breath sounds: Normal breath sounds.  Abdominal:     General: Bowel sounds are normal. There is no distension.     Palpations: Abdomen is soft. There is no mass.     Tenderness: There is no abdominal tenderness. There is no guarding.     Hernia: No hernia is present.  Musculoskeletal:     Right lower leg: No edema.     Left lower leg: No edema.  Skin:    General: Skin is warm.  Neurological:     General: No focal deficit present.     Mental Status: She is alert and oriented to person, place, and time.     Cranial Nerves: Cranial nerves 2-12 are intact.     Motor: Motor function is intact.  Psychiatric:        Attention and Perception: Attention normal.        Mood and Affect: Mood normal.        Speech: Speech normal.        Behavior: Behavior normal. Behavior is cooperative.        Cognition and Memory: Cognition normal.      Labs on Admission: I have personally reviewed following labs and imaging studies BMET Recent Labs  Lab 07/08/22 1614 07/08/22 2220 07/09/22 0053  NA 135 139 140  K 4.7 4.1 4.2  CL 101 108 108  CO2 19* 21* 25  BUN 16 17 16    CREATININE 0.85 0.83 0.86  GLUCOSE 363* 242* 149*   Electrolytes Recent Labs  Lab 07/08/22 1614 07/08/22  2220 07/09/22 0053  CALCIUM 9.8 8.5* 8.5*   Sepsis Markers Recent Labs  Lab 07/08/22 1729 07/08/22 2220 07/09/22 0053  LATICACIDVEN 3.9* 3.8* 2.9*   ABG No results for input(s): "PHART", "PCO2ART", "PO2ART" in the last 168 hours. Liver Enzymes Recent Labs  Lab 07/08/22 1614  AST 29  ALT 23  ALKPHOS 82  BILITOT 1.0  ALBUMIN 4.5   Cardiac Enzymes No results for input(s): "TROPONINI", "PROBNP" in the last 168 hours. No results found for: "DDIMER" Coag's No results for input(s): "APTT", "INR" in the last 168 hours.  Recent Results (from the past 240 hour(s))  Wet prep, genital     Status: None   Collection Time: 07/08/22  7:33 PM  Result Value Ref Range Status   Yeast Wet Prep HPF POC NONE SEEN NONE SEEN Final   Trich, Wet Prep NONE SEEN NONE SEEN Final   Clue Cells Wet Prep HPF POC NONE SEEN NONE SEEN Final   WBC, Wet Prep HPF POC <10 <10 Final   Sperm NONE SEEN  Final    Comment: Performed at [facility], [address] [place], [place] [address]     Current Facility-Administered Medications:    acetaminophen (TYLENOL) tablet 650 mg, 650 mg, Oral, Q6H PRN **OR** acetaminophen (TYLENOL) suppository 650 mg, 650 mg, Rectal, Q6H PRN, Allena Katz, Eliezer Mccoy, MD   bisacodyl (DULCOLAX) EC tablet 5 mg, 5 mg, Oral, Daily PRN, Gertha Calkin, MD   ceFEPIme (MAXIPIME) 2 g in sodium chloride 0.9 % 100 mL IVPB, 2 g, Intravenous, Q8H, Jaynie Bream, RPH, Stopped at 07/08/22 2115   citalopram (CELEXA) tablet 40 mg, 40 mg, Oral, Daily, Irena Cords V, MD, 40 mg at 07/09/22 0013   dextrose 5 % in lactated ringers infusion, , Intravenous, Continuous, Irena Cords V, MD, Stopped at 07/09/22 0031   dextrose 50 % solution 0-50 mL, 0-50 mL, Intravenous, PRN, Irena Cords V, MD   docusate sodium (COLACE) capsule 100 mg, 100 mg, Oral, BID, Irena Cords V, MD   heparin  injection 5,000 Units, 5,000 Units, Subcutaneous, Q8H, Irena Cords V, MD, 5,000 Units at 07/09/22 0013   insulin aspart (novoLOG) injection 0-15 Units, 0-15 Units, Subcutaneous, TID WC, Harshini Trent V, MD   lactated ringers bolus 500 mL, 500 mL, Intravenous, Once, Irena Cords V, MD, Last Rate: 500 mL/hr at 07/09/22 0037, 500 mL at 07/09/22 0037   lactated ringers infusion, , Intravenous, Continuous, Irena Cords V, MD, Last Rate: 150 mL/hr at 07/09/22 0036, New Bag at 07/09/22 0036   lamoTRIgine (LAMICTAL) tablet 25 mg, 25 mg, Oral, Daily, Irena Cords V, MD, 25 mg at 07/09/22 0014   LORazepam (ATIVAN) tablet 0.5 mg, 0.5 mg, Oral, Daily PRN, Gertha Calkin, MD   metroNIDAZOLE (FLAGYL) IVPB 500 mg, 500 mg, Intravenous, Q12H, Mychaela Lennartz V, MD   ondansetron (ZOFRAN) tablet 4 mg, 4 mg, Oral, Q6H PRN **OR** ondansetron (ZOFRAN) injection 4 mg, 4 mg, Intravenous, Q6H PRN, Allena Katz, Cherylann Hobday V, MD   pantoprazole (PROTONIX) injection 40 mg, 40 mg, Intravenous, Q12H, Anavey Coombes V, MD   polyethylene glycol (MIRALAX / GLYCOLAX) packet 17 g, 17 g, Oral, Daily PRN, Irena Cords V, MD   simvastatin (ZOCOR) tablet 20 mg, 20 mg, Oral, QHS, Achaia Garlock V, MD, 20 mg at 07/09/22 0013  COVID-19 Labs No results for input(s): "DDIMER", "FERRITIN", "LDH", "CRP" in the last 72 hours. No results found for: "SARSCOV2NAA"  Radiological Exams on Admission: CT Angio Abd/Pel W and/or Wo Contrast  Result Date: 07/08/2022 CLINICAL DATA:  Vomiting, abdominal pain, mesenteric ischemia EXAM: CTA ABDOMEN AND PELVIS WITHOUT AND WITH CONTRAST TECHNIQUE: Multidetector CT imaging of the abdomen and pelvis was performed using the standard protocol during bolus administration of intravenous contrast. Multiplanar reconstructed images and MIPs were obtained and reviewed to evaluate the vascular anatomy. RADIATION DOSE REDUCTION: This exam was performed according to the departmental dose-optimization program which includes automated exposure control,  adjustment of the mA and/or kV according to patient size and/or use of iterative reconstruction technique. CONTRAST:  157mL OMNIPAQUE IOHEXOL 350 MG/ML SOLN COMPARISON:  None Available. FINDINGS: VASCULAR Normal contour and caliber of the abdominal aorta. No evidence of aneurysm, dissection, or other acute aortic pathology. Standard branching pattern with solitary bilateral renal arteries. The aortic branch vessels are widely patent, with specific attention to the superior mesenteric artery and its proximal order branches. Moderate mixed calcific atherosclerosis Review of the MIP images confirms the above findings. NON-VASCULAR Lower chest: No acute abnormality.  Small hiatal hernia. Hepatobiliary: No solid liver abnormality is seen. Hepatic steatosis. Somewhat coarse, nodular contour of the liver. No gallstones, gallbladder wall thickening, or biliary dilatation. Pancreas: Unremarkable. No pancreatic ductal dilatation or surrounding inflammatory changes. Spleen: Normal in size without significant abnormality. Adrenals/Urinary Tract: Adrenal glands are unremarkable. Kidneys are normal, without renal calculi, solid lesion, or hydronephrosis. Bladder is unremarkable. Stomach/Bowel: Stomach is within normal limits. Descending duodenal diverticulum appendix appears normal. No evidence of bowel wall thickening, distention, or inflammatory changes. Lymphatic: No enlarged abdominal or pelvic lymph nodes. Reproductive: No mass. Cystic lesion of the right ovary measuring 5.8 x 3.7 cm (series 4, image 163). Other: No abdominal wall hernia or abnormality. No ascites. Musculoskeletal: No acute or significant osseous findings. IMPRESSION: 1. Normal contour and caliber of the abdominal aorta. No evidence of aneurysm, dissection, or other acute aortic pathology. 2. The aortic branch vessels are widely patent, with specific attention to the superior mesenteric artery and its proximal order branches per clinical concern for  mesenteric ischemia. 3. Hepatic steatosis with somewhat coarse, nodular contour of the liver, suggestive of cirrhosis. 4. Incidental cystic lesion of the right ovary measuring 5.8 x 3.7 cm. Recommend follow-up US in 6-12 months. Note: This recommendation does not apply to premenarchal patients and to those with increased risk (genetic, family history, elevated tumor markers or other high-risk factors) of ovarian cancer. Reference: JACR 2020 Feb; 17(2):248-254 Electronically Signed   By: Delanna Ahmadi M.D.   On: 07/08/2022 18:46    EKG: Independently reviewed.  None    Assessment and Plan: * Nausea & vomiting D/d include PUD/Gastritis related. Pt takes meloxicam.   Supportive care with IVF and antiemetics.    Abdominal pain Epigastric abd pain. Cirrhosis on ct.  IV PPI.  PUD related.  PRN pain meds.  ADVISED PT TO REFRAIN FROM USING ANY NSAIDS.  Labs resulted showing pt is in DKA and attribute abd pain n/v/ to DKA and lactic acidosis.   DKA (diabetic ketoacidosis) ([place]) Elevated BHA in setting of hyperglycemia will start DKA protocol.    Latest Ref Rng & Units 07/08/2022    4:14 PM 03/23/2022    7:08 AM 07/07/2021    7:42 AM  CMP  Glucose 70 - 99 mg/dL 363  216  215   BUN 6 - 20 mg/dL 16  10  15    Creatinine 0.44 - 1.00 mg/dL 0.85  0.84  0.72   Sodium 135 - 145 mmol/L 135  141  138   Potassium  3.5 - 5.1 mmol/L 4.7  4.3  5.0   Chloride 98 - 111 mmol/L 101  102  99   CO2 22 - 32 mmol/L 19  21  22    Calcium 8.9 - 10.3 mg/dL 9.8  9.2  10.0   Total Protein 6.5 - 8.1 g/dL 8.0  6.7  6.7   Total Bilirubin 0.3 - 1.2 mg/dL 1.0  0.2  0.3   Alkaline Phos 38 - 126 U/L 82  90  113   AST 15 - 41 U/L 29  18  22    ALT 0 - 44 U/L 23  18  22        Lactic acidosis Attribute to dehydration and metformin.   Severe sepsis ([place]) D/d also possible for underlying infection. Pt has elevated lactic and source unclear. We will cont with t/t acidosis from dka and cont with ivf bolus and monitor for  lactic improvement. Also cont with emperic iv abx .  Pt started on cefepime and flagyl which we will continue. Hypotension in ED aggressively resuscitated and will follow in stepdown. Once lactic acid and BP improves we will transfer back to tele unit.  repeat lactic has improved and acidosis has resolved. We will move pt to tele unit.  Hyperlipidemia associated with type 2 diabetes mellitus ([place]) Pt takes simvastatin and Lipids not at goal . D/w pt about f/u with pcp for change in therapy.    Type 2 diabetes mellitus with hyperglycemia ([facility]) Glycemic protocol. Hold metformin.     Tobacco use Counseled pt on tobacco cessation.  Nicotine patch.   Hypertension Vitals:   07/08/22 1615 07/08/22 1723 07/08/22 2137  BP: (!) 144/80 (!) 132/91 107/62  Cont lisinopril and PRN hydralazine if needed.  Continue with aggressive IVF hydration and hold lisinopril for tonight.    Depression Cont citalopram and lorazepam.     DVT prophylaxis:  Heparin.    Code Status:  Full code    Family Communication:  Lobdell,Patrick (Significant other)  [phone] (Mobile)   Disposition Plan:  Home    Consults called:  None   Admission status: Inpatient.     Para Skeans MD [facility]  6 PM- 2 AM. Please contact me via secure Chat 6 PM-2 AM. [phone] ( Pager ) To contact the [facility] Attending or Consulting provider [place] or covering provider during after hours [place], for this patient.   Check the care team in [facility] and look for a) attending/consulting [facility] provider listed and b) the [facility] team listed Log into [email or web address] and use 's universal password to access. If you do not have the password, please contact the hospital operator. Locate the [facility] provider you are looking for under [facility] and page to a number that you can be directly reached. If you still have difficulty reaching the provider, please page the [facility] (Director on Call) for the Hospitalists  listed on amion for assistance. [email or web address] 07/09/2022, 1:25 AM

## 2022-07-08 NOTE — Progress Notes (Addendum)
PHARMACY -  BRIEF ANTIBIOTIC NOTE   Pharmacy has received consult for cefepime from an ED provider.  The patient's profile has been reviewed for ht/wt/allergies/indication/available labs.    One time order placed for cefepime 2 grams x 1  Noted penicillin allergy (Rash- nonsevere). Will continue with cephalosporin use.  Further antibiotics/pharmacy consults should be ordered by admitting physician if indicated.                       Thank you,  Elliot Gurney, PharmD Clinical Pharmacist  07/08/2022 6:02 PM

## 2022-07-08 NOTE — Assessment & Plan Note (Signed)
Cont citalopram and lorazepam.

## 2022-07-08 NOTE — Assessment & Plan Note (Signed)
Glycemic protocol. Hold metformin.

## 2022-07-08 NOTE — Assessment & Plan Note (Signed)
Counseled pt on tobacco cessation.  Nicotine patch.

## 2022-07-08 NOTE — Progress Notes (Signed)
Pharmacy Antibiotic Note  Michelle Hendricks is a 56 y.o. female admitted on 07/08/2022 with sepsis.  Pharmacy has been consulted for cefepime dosing. Renal function stable.   Plan: Cefepime 2 grams every 8 hours  Height: 5\' 2"  (157.5 cm) Weight: 72.6 kg (160 lb) IBW/kg (Calculated) : 50.1  Temp (24hrs), Avg:97.9 F (36.6 C), Min:97.4 F (36.3 C), Max:98.3 F (36.8 C)  Recent Labs  Lab 07/08/22 1614 07/08/22 1729  WBC 17.0*  --   CREATININE 0.85  --   LATICACIDVEN  --  3.9*    Estimated Creatinine Clearance: 70.6 mL/min (by C-G formula based on SCr of 0.85 mg/dL).    Allergies  Allergen Reactions   Penicillins Rash and Other (See Comments)    Has patient had a PCN reaction causing immediate rash, facial/tongue/throat swelling, SOB or lightheadedness with hypotension: No Has patient had a PCN reaction causing severe rash involving mucus membranes or skin necrosis: No Has patient had a PCN reaction that required hospitalization No Has patient had a PCN reaction occurring within the last 10 years: No If all of the above answers are "NO", then may proceed with Cephalosporin use.    Antimicrobials this admission: cefepime 8/6 >>   Dose adjustments this admission: N/a  Microbiology results: 8/6 BCx: in process   Thank you for allowing pharmacy to be a part of this patient's care.  10/6, PharmD Clinical Pharmacist  07/08/2022 8:17 PM

## 2022-07-08 NOTE — Progress Notes (Signed)
CODE SEPSIS - PHARMACY COMMUNICATION  **Broad Spectrum Antibiotics should be administered within 1 hour of Sepsis diagnosis**  Time Code Sepsis Called/Page Received: 1800  Antibiotics Ordered: cefepime, metronidazole  Time of 1st antibiotic administration: 1900  Additional action taken by pharmacy: msg  If necessary, Name of Provider/Nurse Contacted: RN    Elliot Gurney, PharmD Clinical Pharmacist  07/08/2022 7:35 PM

## 2022-07-09 ENCOUNTER — Inpatient Hospital Stay: Payer: 59

## 2022-07-09 DIAGNOSIS — A419 Sepsis, unspecified organism: Secondary | ICD-10-CM | POA: Diagnosis present

## 2022-07-09 DIAGNOSIS — R6520 Severe sepsis without septic shock: Secondary | ICD-10-CM | POA: Diagnosis present

## 2022-07-09 DIAGNOSIS — R739 Hyperglycemia, unspecified: Secondary | ICD-10-CM

## 2022-07-09 DIAGNOSIS — F32A Depression, unspecified: Secondary | ICD-10-CM

## 2022-07-09 DIAGNOSIS — R1114 Bilious vomiting: Secondary | ICD-10-CM | POA: Diagnosis not present

## 2022-07-09 DIAGNOSIS — R109 Unspecified abdominal pain: Secondary | ICD-10-CM | POA: Diagnosis not present

## 2022-07-09 DIAGNOSIS — I10 Essential (primary) hypertension: Secondary | ICD-10-CM

## 2022-07-09 DIAGNOSIS — E872 Acidosis, unspecified: Secondary | ICD-10-CM | POA: Diagnosis present

## 2022-07-09 LAB — GLUCOSE, CAPILLARY
Glucose-Capillary: 138 mg/dL — ABNORMAL HIGH (ref 70–99)
Glucose-Capillary: 227 mg/dL — ABNORMAL HIGH (ref 70–99)

## 2022-07-09 LAB — LACTIC ACID, PLASMA
Lactic Acid, Venous: 2.9 mmol/L (ref 0.5–1.9)
Lactic Acid, Venous: 1.3 mmol/L (ref 0.5–1.9)

## 2022-07-09 LAB — BASIC METABOLIC PANEL
Anion gap: 7 (ref 5–15)
BUN: 16 mg/dL (ref 6–20)
CO2: 25 mmol/L (ref 22–32)
Calcium: 8.5 mg/dL — ABNORMAL LOW (ref 8.9–10.3)
Chloride: 108 mmol/L (ref 98–111)
Creatinine, Ser: 0.86 mg/dL (ref 0.44–1.00)
GFR, Estimated: >60 mL/min (ref >60)
Glucose, Bld: 149 mg/dL — ABNORMAL HIGH (ref 70–99)
Potassium: 4.2 mmol/L (ref 3.5–5.1)
Sodium: 140 mmol/L (ref 135–145)

## 2022-07-09 LAB — PROCALCITONIN: Procalcitonin: <0.1 ng/mL

## 2022-07-09 LAB — ETHANOL: Alcohol, Ethyl (B): <10 mg/dL (ref <10)

## 2022-07-09 LAB — PROTIME-INR
INR: 1.1 (ref 0.8–1.2)
Prothrombin Time: 14 seconds (ref 11.4–15.2)

## 2022-07-09 LAB — HEMOGLOBIN A1C
Hgb A1c MFr Bld: 9.7 % — ABNORMAL HIGH (ref 4.8–5.6)
Mean Plasma Glucose: 231.69 mg/dL

## 2022-07-09 LAB — HIV ANTIBODY (ROUTINE TESTING W REFLEX): HIV Screen 4th Generation wRfx: NONREACTIVE

## 2022-07-09 MED ORDER — PANTOPRAZOLE SODIUM 40 MG IV SOLR
40.0000 mg | Freq: Two times a day (BID) | INTRAVENOUS | Status: DC
  Administered 2022-07-09 (×2): 40 mg via INTRAVENOUS
  Filled 2022-07-09 (×2): qty 10

## 2022-07-09 MED ORDER — INSULIN ASPART 100 UNIT/ML IJ SOLN
0.0000 [IU] | Freq: Three times a day (TID) | INTRAMUSCULAR | Status: DC
  Administered 2022-07-09: 5 [IU] via SUBCUTANEOUS
  Filled 2022-07-09: qty 1

## 2022-07-09 MED ORDER — LACTATED RINGERS IV BOLUS (SEPSIS)
500.0000 mL | Freq: Once | INTRAVENOUS | Status: AC
  Administered 2022-07-09: 500 mL via INTRAVENOUS

## 2022-07-09 NOTE — Sepsis Progress Note (Signed)
Riley took place with ED RN with his recognition of chat with failure to respond, Following through, attempting to check on 2nd lactic

## 2022-07-09 NOTE — Assessment & Plan Note (Signed)
Attribute to dehydration and metformin.

## 2022-07-09 NOTE — Inpatient Diabetes Management (Addendum)
Inpatient Diabetes Program Recommendations  AACE/ADA: New Consensus Statement on Inpatient Glycemic Control   Target Ranges:  Prepandial:   less than 140 mg/dL      Peak postprandial:   less than 180 mg/dL (1-2 hours)      Critically ill patients:  140 - 180 mg/dL    Latest Reference Range & Units 07/08/22 21:43 07/08/22 23:41 07/09/22 02:17 07/09/22 07:27  Glucose-Capillary 70 - 99 mg/dL 782 (H) 423 (H) 536 (H) 227 (H)    Latest Reference Range & Units 03/23/22 07:08 04/25/22 15:22 07/09/22 00:53  Hemoglobin A1C 4.8 - 5.6 % 10.0 (H) 8.9 ! 9.7 (H)    Latest Reference Range & Units 07/08/22 16:14  Glucose 70 - 99 mg/dL 144 (H)   Review of Glycemic Control  Diabetes history: DM2 Outpatient Diabetes medications: Alogliptin 25 mg daily Current orders for Inpatient glycemic control: Novolog 0-15 units TID with meals  Inpatient Diabetes Program Recommendations:    Insulin: Please consider ordering Novolog 0-5 units QHS for bedtime correction.  Oral DM medication: May want to consider ordering Tradjenta 5 mg daily.  HbgA1C: A1C 9.7% on 07/09/22 indicating an average glucose of 232 mg/dl over the past 2-3 months.  NOTE: Patient with DM2 admitted with lactic acidosis and severe sepsis. Initial glucose 363 mg/dl on 01/31/53 at 00:86. Per chart review, noted patient seen Alfredia Ferguson, PA-C on 04/25/22 and per note " Pt never picked up alogliptin. Resent to pharmacy. Open to trying. Discussed many other alternative medications. A1C improved, largest change was cutting out soft drinks."  Will plan to follow up with patient today.  Addendum 07/09/22@11 :40-Spoke with patient at bedside regarding DM control. Patient reports that she is not taking any DM medications outpatient currently. Asked if she ever took or filled Alogliptin ([place]) and patient states that she is not familiar with that medication at all and that she has not taken it.  Patient reports that she has been tried on multiple DM  medications in the past and she feels that the medications made her feel bad and/or made her DM control worse. Patient states that he has taken Metformin but does not tolerate it. Patient reports that she has taken Trulicity (took it once and was on the couch for a week).  Patient reports that she has seen Dr. Tedd Sias (Endocrinologist) in the past and patient reports "she is the one who put me on the Trulicity."  Patient reports that she has a glucometer and testing supplies at home but she does not check glucose. She states she needs a new batter for her glucometer. Patient reports that her A1C was 13% and she was able to get it down to 8.7% (within 1 year) with making dietary and exercise changes. Discussed current A1C of 9.7% indicating an average glucose of 232 mg/dl. Discussed glucose and A1C goals. Discussed importance of checking CBGs and maintaining good CBG control to prevent long-term and short-term complications. Explained how hyperglycemia leads to damage within blood vessels which lead to the common complications seen with uncontrolled diabetes. Stressed to the patient the importance of improving glycemic control to prevent further complications from uncontrolled diabetes. Discussed progressive nature of Type 2 DM and explained that if she if following a carb modified diet and exercising, her average glucose is elevated based on A1C results. Explained that she needs to be taking DM medication outpatient to get DM under better control. Patient states that she feels like she can get her A1C down more and she feels  that the DM medications she has been prescribed don't work or cost too much. Encouraged patient to follow up with her PCP and to find a new Endocrinologist if she does not plan to see Dr. Tedd Sias again.   Patient verbalized understanding of information discussed and reports no further questions at this time related to diabetes.  Thanks, Orlando Penner, RN, MSN, CDCES Diabetes Coordinator Inpatient  Diabetes Program [phone] (Team Pager from 8am to 5pm)

## 2022-07-09 NOTE — Assessment & Plan Note (Addendum)
D/d also possible for underlying infection. Pt has elevated lactic and source unclear. We will cont with t/t acidosis from dka and cont with ivf bolus and monitor for lactic improvement. Also cont with emperic iv abx .  Pt started on cefepime and flagyl which we will continue. Hypotension in ED aggressively resuscitated and will follow in stepdown. Once lactic acid and BP improves we will transfer back to tele unit.  repeat lactic has improved and acidosis has resolved. We will move pt to tele unit.

## 2022-07-10 ENCOUNTER — Telehealth: Payer: Self-pay

## 2022-07-10 NOTE — Telephone Encounter (Signed)
Transition Care Management Unsuccessful Follow-up Telephone Call  Date of discharge and from where:  TCM DC [facility] 07-09-22 Dx: n/v  Attempts:  1st Attempt  Reason for unsuccessful TCM follow-up call:  Left voice message

## 2022-07-11 ENCOUNTER — Encounter: Payer: Self-pay | Admitting: Physician Assistant

## 2022-07-11 ENCOUNTER — Ambulatory Visit: Payer: 59 | Admitting: Physician Assistant

## 2022-07-11 VITALS — BP 153/84 | HR 81 | Temp 98.5°F | Ht 62.0 in | Wt 162.1 lb

## 2022-07-11 DIAGNOSIS — D582 Other hemoglobinopathies: Secondary | ICD-10-CM

## 2022-07-11 DIAGNOSIS — E1165 Type 2 diabetes mellitus with hyperglycemia: Secondary | ICD-10-CM | POA: Diagnosis not present

## 2022-07-11 MED ORDER — ALOGLIPTIN BENZOATE 25 MG PO TABS: 25.0000 mg | ORAL_TABLET | Freq: Every day | ORAL | 1 refills | Status: DC

## 2022-07-11 NOTE — Assessment & Plan Note (Signed)
Pt is noncompliant with meds/recommendations. Stressed the importance of checking fasting blood sugar daily. Stressed the importance of lowering her blood sugar, explained what happened in her hospital course.  Pt has failed metformin, glipizide. I see she has previously been on insulin, but she is unable to recall this.  I have rx Aloglipitin several times, but pt has never started. She reports today she will be consistent with meds. Re-sent. Advised we will likely need a second agent, probably SGLT2, f/u 6-8 weeks.  D/t pts financial situation, ref to pharmacy

## 2022-07-11 NOTE — Progress Notes (Signed)
I,[name],acting as a Neurosurgeon for [employer], PA-C.,have documented all relevant documentation on the behalf of Alfredia Ferguson, PA-C,as directed by  Alfredia Ferguson, PA-C while in the presence of Alfredia Ferguson, PA-C.   Established patient visit   Patient: Michelle Hendricks   DOB: 1966-07-06   56 y.o. Female  MRN: [id number] Visit Date: 07/11/2022  Today's healthcare provider: Alfredia Ferguson, PA-C   Cc.hospital f/u  Subjective    HPI  Follow up Hospitalization  Patient was admitted to [facility] on 07/08/22 and discharged on 07/09/22. She was treated for nausea and vomiting. Dx was ultimately DKA, CT angio done given lactic acid elevation, she has no evidence of ischemia or other intra-abdominal emergency.  She does have incidental note of cirrhosis, which could explain her slightly disproportional lactic acid elevation. Ultrasound of the liver was completed and saw moderate hepatic steatosis.   Initially treated for sepsis with broad spectrum antibiotics.  Telephone follow up was done on 07/10/22 She reports fair compliance with treatment. She reports this condition is  improved but still feeling a little groggy .  Reports she can eat but does not have much of a appetite. Reports low grade temperature yesterday of 99.4. States she took it due to feeling warm and still dragging and that she checked today but it was back normal.   Pt denies checking blood sugar at all. Reports the pharmacy never told her the diabetes medication was ready. Denies knowing what caused her symptoms in the hospital.  Denies weakness, shortness of breath, chest pain, abdominal pain, nausea, vomiting.  ----------------------------------------------------------------------------------------- -   Medications: Outpatient Medications Prior to Visit  Medication Sig   citalopram (CELEXA) 40 MG tablet Dose increased to 40mg . Has meds for now   lamoTRIgine (LAMICTAL) 25 MG tablet TAKE 1 TABLET (25 MG TOTAL)  BY MOUTH DAILY.   lisinopril (ZESTRIL) 10 MG tablet Take 1 tablet (10 mg total) by mouth daily.   LORazepam (ATIVAN) 0.5 MG tablet Take 1 tablet (0.5 mg total) by mouth daily as needed for anxiety (panic attack).   simvastatin (ZOCOR) 20 MG tablet Take 1 tablet (20 mg total) by mouth at bedtime.   [DISCONTINUED] Alogliptin Benzoate 25 MG TABS Take 25 mg by mouth daily. (Patient not taking: Reported on 07/11/2022)   No facility-administered medications prior to visit.    Review of Systems  Constitutional:  Positive for fatigue. Negative for fever.  Respiratory:  Negative for cough and shortness of breath.   Cardiovascular:  Negative for chest pain and leg swelling.  Gastrointestinal:  Negative for abdominal pain.  Neurological:  Negative for dizziness and headaches.      Objective    Blood pressure (!) 153/84, pulse 81, temperature 98.5 F (36.9 C), temperature source Oral, height 5\' 2"  (1.575 m), weight 162 lb 1.6 oz (73.5 kg), last menstrual period 04/16/2015, SpO2 98 %.   Physical Exam Constitutional:      General: She is awake.     Appearance: She is well-developed. She is obese.  HENT:     Head: Normocephalic.  Eyes:     Conjunctiva/sclera: Conjunctivae normal.  Cardiovascular:     Rate and Rhythm: Normal rate and regular rhythm.     Heart sounds: Normal heart sounds.  Pulmonary:     Effort: Pulmonary effort is normal.     Breath sounds: Normal breath sounds.  Skin:    General: Skin is warm.  Neurological:     Mental Status: She is alert and  oriented to person, place, and time.  Psychiatric:        Attention and Perception: Attention normal.        Mood and Affect: Mood is anxious. Affect is tearful.        Speech: Speech normal.        Behavior: Behavior is cooperative.      No results found for any visits on 07/11/22.  Assessment & Plan     Problem List Items Addressed This Visit       Endocrine   Type 2 diabetes mellitus with hyperglycemia (HCC) - Primary     Pt is noncompliant with meds/recommendations. Stressed the importance of checking fasting blood sugar daily. Stressed the importance of lowering her blood sugar, explained what happened in her hospital course.  Pt has failed metformin, glipizide. I see she has previously been on insulin, but she is unable to recall this.  I have rx Aloglipitin several times, but pt has never started. She reports today she will be consistent with meds. Re-sent. Advised we will likely need a second agent, probably SGLT2, f/u 6-8 weeks.  D/t pts financial situation, ref to pharmacy       Relevant Medications   Alogliptin Benzoate 25 MG TABS   Other Relevant Orders   Comprehensive Metabolic Panel (CMET)   AMB Referral to [facility] Coordinaton     Other   Elevated hemoglobin (HCC)    Likely related to DKA or smoking, will recheck in 1-2 weeks      Relevant Orders   CBC w/Diff/Platelet    Return in about 6 weeks (around 08/22/2022) for DMII.      I, Alfredia Ferguson, PA-C have reviewed all documentation for this visit. The documentation on  07/11/2022  for the exam, diagnosis, procedures, and orders are all accurate and complete.  Alfredia Ferguson, PA-C [facility] [address] [place], [place], [address] Office: [phone] Fax: [phone]   [facility]

## 2022-07-11 NOTE — Assessment & Plan Note (Signed)
Likely related to DKA or smoking, will recheck in 1-2 weeks

## 2022-07-11 NOTE — Discharge Summary (Signed)
Physician Discharge Summary   Patient: Michelle Hendricks MRN: [id number] DOB: [birth year]  Admit date:     07/08/2022  Discharge date: 07/09/2022  Discharge Physician: Delfino Lovett   PCP: Alfredia Ferguson, PA-C   Recommendations at discharge:    F/up with outpt providers as requested  Discharge Diagnoses: Principal Problem:   Nausea & vomiting Active Problems:   Abdominal pain   DKA (diabetic ketoacidosis) (HCC)   Depression   Hypertension   Tobacco use   Type 2 diabetes mellitus with hyperglycemia (HCC)   Hyperlipidemia associated with type 2 diabetes mellitus (HCC)   Severe sepsis (HCC)   Lactic acidosis   Hyperglycemia  Resolved Problems:   Nausea and vomiting  Hospital Course: Assessment and Plan: * Nausea & vomiting Abdominal pain Likely viral gastroenteritis/gastritis. Resolved with symptomatic mgmt. Tolerated diet. No further vomiting/abd pain ADVISED PT TO REFRAIN FROM USING ANY NSAIDS as she is on meloxicam.  Mild DKA and lactic acidosis. Resolved quickly with treatment   Mild DKA (diabetic ketoacidosis) (HCC) Elevated BHA in setting of hyperglycemia. Quick improvement with DKA protocol  Lactic acidosis Attribute to dehydration   Severe sepsis (HCC) Ruled out.  Hyperlipidemia associated with type 2 diabetes mellitus (HCC) Type 2 diabetes mellitus with hyperglycemia (HCC) Hypertension Tobacco use Counseled pt on tobacco cessation.   Depression Cont citalopram and lorazepam.           Disposition: Home Diet recommendation:  Discharge Diet Orders (From admission, onward)     Start     Ordered   07/09/22 0000  Diet - low sodium heart healthy        07/09/22 1142           Carb modified diet DISCHARGE MEDICATION: Allergies as of 07/09/2022       Reactions   Penicillins Rash, Other (See Comments)   Has patient had a PCN reaction causing immediate rash, facial/tongue/throat swelling, SOB or lightheadedness with hypotension: No Has patient had a  PCN reaction causing severe rash involving mucus membranes or skin necrosis: No Has patient had a PCN reaction that required hospitalization No Has patient had a PCN reaction occurring within the last 10 years: No If all of the above answers are "NO", then may proceed with Cephalosporin use.        Medication List     STOP taking these medications    meloxicam 15 MG tablet Commonly known as: MOBIC       TAKE these medications    citalopram 40 MG tablet Commonly known as: CELEXA Dose increased to 40mg . Has meds for now   lamoTRIgine 25 MG tablet Commonly known as: LAMICTAL TAKE 1 TABLET (25 MG TOTAL) BY MOUTH DAILY.   lisinopril 10 MG tablet Commonly known as: ZESTRIL Take 1 tablet (10 mg total) by mouth daily.   LORazepam 0.5 MG tablet Commonly known as: ATIVAN Take 1 tablet (0.5 mg total) by mouth daily as needed for anxiety (panic attack).   simvastatin 20 MG tablet Commonly known as: ZOCOR Take 1 tablet (20 mg total) by mouth at bedtime.        Follow-up Information     , PA-C. Schedule an appointment as soon as possible for a visit in 2 day(s).   Specialty: Physician Assistant Why: [facility] Discharge F/UP Contact information: [address] [place] [phone]                Discharge Exam: [phone] Weights   07/08/22 1612 07/08/22  2137  Weight: 72.6 kg 73.2 kg   55 y f lying in the bed lying in the bed comfortably Lungs clear to auscultation bilaterally Cardiovascular regular rate and rhythm rate  Abdomen soft, benign Neuro alert & awake, non-focal   Condition at discharge: good  The results of significant diagnostics from this hospitalization (including imaging, microbiology, ancillary and laboratory) are listed below for reference.   Imaging Studies: US Abdomen Limited RUQ (LIVER/GB)  Result Date: 07/09/2022 CLINICAL DATA:  Cirrhosis EXAM: ULTRASOUND ABDOMEN LIMITED RIGHT UPPER QUADRANT  COMPARISON:  None Available. FINDINGS: Gallbladder: No gallstones or wall thickening visualized. No sonographic Murphy sign noted by sonographer. Common bile duct: Diameter: 5 mm in proximal diameter Liver: Mild hepatomegaly. Hepatic parenchymal echogenicity is diffusely increased and there is diffuse coarsening of the hepatic echotexture in keeping with changes of probable moderate hepatic steatosis. No focal intrahepatic masses are seen. Portal vein is patent on color Doppler imaging with normal direction of blood flow towards the liver. Other: No ascites IMPRESSION: 1. Moderate hepatic steatosis. 2. Mild hepatomegaly. 3. No focal intrahepatic mass identified. Electronically Signed   By: Helyn Numbers M.D.   On: 07/09/2022 04:35   CT Angio Abd/Pel W and/or Wo Contrast  Result Date: 07/08/2022 CLINICAL DATA:  Vomiting, abdominal pain, mesenteric ischemia EXAM: CTA ABDOMEN AND PELVIS WITHOUT AND WITH CONTRAST TECHNIQUE: Multidetector CT imaging of the abdomen and pelvis was performed using the standard protocol during bolus administration of intravenous contrast. Multiplanar reconstructed images and MIPs were obtained and reviewed to evaluate the vascular anatomy. RADIATION DOSE REDUCTION: This exam was performed according to the departmental dose-optimization program which includes automated exposure control, adjustment of the mA and/or kV according to patient size and/or use of iterative reconstruction technique. CONTRAST:  OMNIPAQUE IOHEXOL 350 MG/ML SOLN COMPARISON:  None Available. FINDINGS: VASCULAR Normal contour and caliber of the abdominal aorta. No evidence of aneurysm, dissection, or other acute aortic pathology. Standard branching pattern with solitary bilateral renal arteries. The aortic branch vessels are widely patent, with specific attention to the superior mesenteric artery and its proximal order branches. Moderate mixed calcific atherosclerosis Review of the MIP images confirms the above  findings. NON-VASCULAR Lower chest: No acute abnormality.  Small hiatal hernia. Hepatobiliary: No solid liver abnormality is seen. Hepatic steatosis. Somewhat coarse, nodular contour of the liver. No gallstones, gallbladder wall thickening, or biliary dilatation. Pancreas: Unremarkable. No pancreatic ductal dilatation or surrounding inflammatory changes. Spleen: Normal in size without significant abnormality. Adrenals/Urinary Tract: Adrenal glands are unremarkable. Kidneys are normal, without renal calculi, solid lesion, or hydronephrosis. Bladder is unremarkable. Stomach/Bowel: Stomach is within normal limits. Descending duodenal diverticulum appendix appears normal. No evidence of bowel wall thickening, distention, or inflammatory changes. Lymphatic: No enlarged abdominal or pelvic lymph nodes. Reproductive: No mass. Cystic lesion of the right ovary measuring 5.8 x 3.7 cm (series 4, image 163). Other: No abdominal wall hernia or abnormality. No ascites. Musculoskeletal: No acute or significant osseous findings. IMPRESSION: 1. Normal contour and caliber of the abdominal aorta. No evidence of aneurysm, dissection, or other acute aortic pathology. 2. The aortic branch vessels are widely patent, with specific attention to the superior mesenteric artery and its proximal order branches per clinical concern for mesenteric ischemia. 3. Hepatic steatosis with somewhat coarse, nodular contour of the liver, suggestive of cirrhosis. 4. Incidental cystic lesion of the right ovary measuring 5.8 x 3.7 cm. Recommend follow-up US in 6-12 months. Note: This recommendation does not apply to premenarchal patients and to those  with increased risk (genetic, family history, elevated tumor markers or other high-risk factors) of ovarian cancer. Reference: JACR 2020 Feb; 17(2):248-254 Electronically Signed   By: Delanna Ahmadi M.D.   On: 07/08/2022 18:46    Microbiology: Results for orders placed or performed during the hospital  encounter of 07/08/22  Blood culture (single)     Status: None (Preliminary result)   Collection Time: 07/08/22  5:07 PM   Specimen: BLOOD  Result Value Ref Range Status   Specimen Description BLOOD RIGHT [facility]  Final   Special Requests   Final    BOTTLES DRAWN AEROBIC AND ANAEROBIC Blood Culture adequate volume   Culture   Final    NO GROWTH 3 DAYS Performed at [facility], [address] [place], [place] [address]    Report Status PENDING  Incomplete  Wet prep, genital     Status: None   Collection Time: 07/08/22  7:33 PM  Result Value Ref Range Status   Yeast Wet Prep HPF POC NONE SEEN NONE SEEN Final   Trich, Wet Prep NONE SEEN NONE SEEN Final   Clue Cells Wet Prep HPF POC NONE SEEN NONE SEEN Final   WBC, Wet Prep HPF POC <10 <10 Final   Sperm NONE SEEN  Final    Comment: Performed at [facility], [address] [place], [place] [address]  Blood culture (single)     Status: None (Preliminary result)   Collection Time: 07/08/22 10:20 PM   Specimen: BLOOD  Result Value Ref Range Status   Specimen Description BLOOD LEFT AC  Final   Special Requests   Final    BOTTLES DRAWN AEROBIC AND ANAEROBIC Blood Culture adequate volume   Culture   Final    NO GROWTH 3 DAYS Performed at [facility], [address] [place], [place] [address]    Report Status PENDING  Incomplete    Labs: CBC: Recent Labs  Lab 07/08/22 1614  WBC 17.0*  HGB 16.5*  HCT 47.5*  MCV 86.5  PLT [id number]   Basic Metabolic Panel: Recent Labs  Lab 07/08/22 1614 07/08/22 2220 07/09/22 0053  NA 135 139 140  K 4.7 4.1 4.2  CL 101 108 108  CO2 19* 21* 25  GLUCOSE 363* 242* 149*  BUN 16 17 16   CREATININE 0.85 0.83 0.86  CALCIUM 9.8 8.5* 8.5*   Liver Function Tests: Recent Labs  Lab 07/08/22 1614  AST 29  ALT 23  ALKPHOS 82  BILITOT 1.0  PROT 8.0  ALBUMIN 4.5   CBG: Recent Labs  Lab 07/08/22 2143 07/08/22 2341 07/09/22 0217 07/09/22 0727  GLUCAP 239* 201*  138* 227*    Discharge time spent: greater than 30 minutes.  Signed: Max Sane, MD [facility] 07/11/2022

## 2022-07-12 ENCOUNTER — Telehealth: Payer: Self-pay | Admitting: Physician Assistant

## 2022-07-12 ENCOUNTER — Other Ambulatory Visit: Payer: Self-pay | Admitting: Physician Assistant

## 2022-07-12 DIAGNOSIS — E1165 Type 2 diabetes mellitus with hyperglycemia: Secondary | ICD-10-CM

## 2022-07-12 MED ORDER — SAXAGLIPTIN HCL 5 MG PO TABS: 5.0000 mg | ORAL_TABLET | Freq: Every day | ORAL | 1 refills | Status: DC

## 2022-07-12 NOTE — Telephone Encounter (Signed)
Tcm note needed

## 2022-07-12 NOTE — Telephone Encounter (Signed)
Continuing note below  Insurance will not cover Alogliptin Benzoate 25 MG TABS

## 2022-07-12 NOTE — Telephone Encounter (Signed)
Patient states her insurance will not cover  Patient inquiring if one of the alternatives listed below can be prescribed instead  Nefina, tradjenta, onglyza   Please advise

## 2022-07-13 LAB — CULTURE, BLOOD (SINGLE)
Culture: NO GROWTH
Culture: NO GROWTH
Special Requests: ADEQUATE
Special Requests: ADEQUATE

## 2022-07-14 ENCOUNTER — Other Ambulatory Visit (HOSPITAL_COMMUNITY): Payer: Self-pay | Admitting: Psychiatry

## 2022-07-16 ENCOUNTER — Encounter: Payer: Self-pay | Admitting: Pharmacist

## 2022-07-16 ENCOUNTER — Telehealth: Payer: Self-pay | Admitting: Pharmacist

## 2022-07-16 DIAGNOSIS — E1165 Type 2 diabetes mellitus with hyperglycemia: Secondary | ICD-10-CM

## 2022-07-16 NOTE — Chronic Care Management (AMB) (Signed)
07/16/2022 Name: Michelle Hendricks MRN: [id number] DOB: 12-25-1965  I connected with Michelle Hendricks on 07/16/22 by telephone outreach and verified that I am speaking with the correct person using two identifiers.  Patient was referred to the pharmacist by PCP for medication assistance related to Type 2 Diabetes.  From review of chart, note patient admitted to [facility] 07/08/2022 to 07/09/2022 related to nausea and vomiting/DKA  Subjective:  Care Team: Primary Care Provider: Alfredia Ferguson, PA-C ; Next Scheduled Visit: 08/24/2022   Medication Access/Adherence  Current Pharmacy:  [id number] [place], [place] - [address] [place] [address] Phone: [phone] Fax: [phone]   Patient reports affordability concerns with their medications: No  Reports previous cost barrier resolved as now switched from alogliptin to Onglyza, which patient reports is affordable as a tier 2 option through her [facility] [employer] plan Patient reports access/transportation concerns to their pharmacy: No  Patient reports adherence concerns with their medications:  No     Objective: Lab Results  Component Value Date   HGBA1C 9.7 (H) 07/09/2022    Lab Results  Component Value Date   CREATININE 0.86 07/09/2022   BUN 16 07/09/2022   NA 140 07/09/2022   K 4.2 07/09/2022   CL 108 07/09/2022   CO2 25 07/09/2022    Lab Results  Component Value Date   CHOL 181 03/23/2022   HDL 37 (L) 03/23/2022   LDLCALC 70 03/23/2022   TRIG 476 (H) 03/23/2022   CHOLHDL 4.9 (H) 03/23/2022    Medications Reviewed Today     Reviewed by Alfredia Ferguson, PA-C (Physician Assistant Certified) on 07/11/22 at 1549  Med List Status: <None>   Medication Order Taking? Sig Documenting Provider Last Dose Status Informant  Alogliptin Benzoate 25 MG TABS [id number]  Take 25 mg by mouth daily. Alfredia Ferguson, PA-C  Active   citalopram (CELEXA) 40 MG tablet [id number] Yes Dose increased to 40mg . Has meds for  now , MD Taking Active   lamoTRIgine (LAMICTAL) 25 MG tablet Thresa Ross Yes TAKE 1 TABLET (25 MG TOTAL) BY MOUTH DAILY. [id number], MD Taking Active   lisinopril (ZESTRIL) 10 MG tablet Thresa Ross Yes Take 1 tablet (10 mg total) by mouth daily. [id number], PA-C Taking Active   LORazepam (ATIVAN) 0.5 MG tablet Alfredia Ferguson Yes Take 1 tablet (0.5 mg total) by mouth daily as needed for anxiety (panic attack). [id number], PA-C Taking Active   simvastatin (ZOCOR) 20 MG tablet Alfredia Ferguson Yes Take 1 tablet (20 mg total) by mouth at bedtime. Chrismon, [id number], PA-C Taking Active             Type 2 Diabetes: Uncontrolled; current treatment: Onglyza 5 mg daily (started on 8/10) Reports tolerating well Previous therapies tried: metformin (GI side effects), glipizide, Trulicity 1.5 mg dose(vomiting), pioglitazone Unable to review specific readings today, but recalls recent before meal (lunch and supper) readings ranging: 165-180 Current dietary habits: breakfast: oatmeal and 2 boiled eggs, no yolk; lunch: chicken and bowl of cherry tomatoes and cucumbers with ranch; supper: BBQ and 1 piece of bread; drinks: water; ice tea with Splenda Counsel on importance of having well balanced meals spread throughout the day, while controlling carbohydrate portion sizes Reports since discharged from hospital, she has significantly improved her diet, cutting back on sweets and soda Exercise: walking throughout the day at work Encourage patient to monitor morning fasting blood sugar daily, as well as may check 2-hour post meal readings to use a feedback on  dietary choices. Patient to keep log of monitoring results and have this record to review at upcoming appointments  Tobacco Use: Reports reduced smoking to ~5 cigarettes/day Motivation: reports motivated to quit smoking after recent hospitalization Triggers: stress and driving Strategies: reduced smoking while driving; listening to the radio  while she drives Patient not interested in medications/nicotine replacement therapy to aid with quit attempt at this time. Rather, prefers to work on reducing down to quit  Follow Up Plan: Pharmacist to follow up with patient again on 9/1 at 11:30 am  Estelle Grumbles, PharmD, [facility] [phone]

## 2022-07-17 ENCOUNTER — Telehealth: Payer: Self-pay | Admitting: Physician Assistant

## 2022-07-17 ENCOUNTER — Other Ambulatory Visit: Payer: Self-pay

## 2022-07-17 MED ORDER — SIMVASTATIN 20 MG PO TABS: 20.0000 mg | ORAL_TABLET | Freq: Every day | ORAL | 3 refills | Status: DC

## 2022-07-17 NOTE — Telephone Encounter (Signed)
[facility] for the following medications: ° °simvastatin (ZOCOR) 20 MG tablet  ° °Please advise. ° °

## 2022-07-19 ENCOUNTER — Ambulatory Visit: Payer: Self-pay | Admitting: *Deleted

## 2022-07-19 NOTE — Telephone Encounter (Signed)
Noted these values are not concerning

## 2022-07-19 NOTE — Telephone Encounter (Signed)
Answer Assessment - Initial Assessment Questions 1. BLOOD GLUCOSE: "What is your blood glucose level?"      288, after eating 3 hours ago 2. ONSET: "When did you check the blood glucose?"     30 minutes ago 3. USUAL RANGE: "What is your glucose level usually?" (e.g., usual fasting morning value, usual evening value)     200 4. KETONES: "Do you check for ketones (urine or blood test strips)?" If Yes, ask: "What does the test show now?"      no 5. TYPE 1 or 2:  "Do you know what type of diabetes you have?"  (e.g., Type 1, Type 2, Gestational; doesn't know)       6. INSULIN: "Do you take insulin?" "What type of insulin(s) do you use? What is the mode of delivery? (syringe, pen; injection or pump)?"      no 7. DIABETES PILLS: "Do you take any pills for your diabetes?" If Yes, ask: "Have you missed taking any pills recently?"     Yes, no missed doses 8. OTHER SYMPTOMS: "Do you have any symptoms?" (e.g., fever, frequent urination, difficulty breathing, dizziness, weakness, vomiting)     fatigues  Protocols used: Diabetes - High Blood Sugar-A-AH

## 2022-07-19 NOTE — Telephone Encounter (Signed)
.   Chief Complaint: Blood Sugar Elevated Symptoms: Blood sugar this AM fasting 176, states usually in 200's. States ate 2 sausage burritos and 3 hours later 288. "I don't know why it went that high." States "Shaky, anxious." Advised to recheck in one hour and placed in Call Back que.  Called pt back at 10:00, rechecked BS, 163. States feels much better, "Just let Lillia Abed know I still feel a little anxious inside."   Frequency: this AM Pertinent Negatives: Patient denies missed meds, no frequent urination Disposition: [] ED /[] Urgent Care (no appt availability in office) / [] Appointment(In office/virtual)/ []  [facility]/ [] Home Care/ [] Refused Recommended Disposition /[]  Mobile Bus/ [x]  Follow-up with PCP Additional Notes: Pt has appt 08/24/22, offered earlier appt, declines. Advised to Cb for any worsening symptoms. Assured pt NT would route to practice for PCPs review.   Reason for Disposition . [1] Blood glucose 240 - 300 mg/dL ( - mmol/L) AND [2] does not  use insulin (e.g., not insulin-dependent; most people with type 2 diabetes)  Protocols used: Diabetes - High Blood Sugar-A-AH

## 2022-07-25 LAB — CBC WITH DIFFERENTIAL/PLATELET
Basophils Absolute: 0 10*3/uL (ref 0.0–0.2)
Basos: 0 %
EOS (ABSOLUTE): 0.1 10*3/uL (ref 0.0–0.4)
Eos: 2 %
Hematocrit: 43.4 % (ref 34.0–46.6)
Hemoglobin: 14.8 g/dL (ref 11.1–15.9)
Immature Grans (Abs): 0 10*3/uL (ref 0.0–0.1)
Immature Granulocytes: 0 %
Lymphocytes Absolute: 2 10*3/uL (ref 0.7–3.1)
Lymphs: 29 %
MCH: 30.3 pg (ref 26.6–33.0)
MCHC: 34.1 g/dL (ref 31.5–35.7)
MCV: 89 fL (ref 79–97)
Monocytes Absolute: 0.5 10*3/uL (ref 0.1–0.9)
Monocytes: 7 %
Neutrophils Absolute: 4.3 10*3/uL (ref 1.4–7.0)
Neutrophils: 62 %
Platelets: 246 10*3/uL (ref 150–450)
RBC: 4.89 x10E6/uL (ref 3.77–5.28)
RDW: 12.8 % (ref 11.7–15.4)
WBC: 6.9 10*3/uL (ref 3.4–10.8)

## 2022-07-25 LAB — COMPREHENSIVE METABOLIC PANEL
ALT: 29 IU/L (ref 0–32)
AST: 26 IU/L (ref 0–40)
Albumin/Globulin Ratio: 1.9 (ref 1.2–2.2)
Albumin: 4.6 g/dL (ref 3.8–4.9)
Alkaline Phosphatase: 75 IU/L (ref 44–121)
BUN/Creatinine Ratio: 13 (ref 9–23)
BUN: 9 mg/dL (ref 6–24)
Bilirubin Total: 0.3 mg/dL (ref 0.0–1.2)
CO2: 21 mmol/L (ref 20–29)
Calcium: 9.2 mg/dL (ref 8.7–10.2)
Chloride: 104 mmol/L (ref 96–106)
Creatinine, Ser: 0.68 mg/dL (ref 0.57–1.00)
Globulin, Total: 2.4 g/dL (ref 1.5–4.5)
Glucose: 176 mg/dL — ABNORMAL HIGH (ref 70–99)
Potassium: 4.5 mmol/L (ref 3.5–5.2)
Sodium: 142 mmol/L (ref 134–144)
Total Protein: 7 g/dL (ref 6.0–8.5)
eGFR: 103 mL/min/{1.73_m2} (ref >59)

## 2022-07-26 ENCOUNTER — Ambulatory Visit: Payer: 59 | Admitting: Physician Assistant

## 2022-08-03 ENCOUNTER — Telehealth: Payer: Self-pay | Admitting: Pharmacist

## 2022-08-03 ENCOUNTER — Ambulatory Visit: Payer: Self-pay | Admitting: Pharmacist

## 2022-08-03 NOTE — Telephone Encounter (Signed)
    07/16/2022 Name: Michelle Hendricks    MRN: [id number]       DOB: [birth year]   I connected with Lavonna Monarch on 07/16/22 by telephone outreach and verified that I am speaking with the correct person using two identifiers.   Patient was referred to the pharmacist by PCP for medication assistance related to Type 2 Diabetes.  Outreach to patient today for follow up, as scheduled. However, when reach patient today, she states that she is not currently home/not a good time to talk.   Follow Up Plan:   1) Patient scheduled for follow up visit with PCP on 08/24/2022.  2) Patient denies current medication questions/concerns  3) Provide patient with contact information for [facility] Pharmacist and ask patient to contact if needed for future medication questions/concerns  Estelle Grumbles, PharmD, [facility] [phone]

## 2022-08-18 ENCOUNTER — Other Ambulatory Visit (HOSPITAL_COMMUNITY): Payer: Self-pay | Admitting: Psychiatry

## 2022-08-24 ENCOUNTER — Ambulatory Visit: Payer: 59 | Admitting: Physician Assistant

## 2022-08-24 ENCOUNTER — Encounter: Payer: Self-pay | Admitting: Physician Assistant

## 2022-08-24 VITALS — BP 125/68 | HR 92 | Ht 62.0 in | Wt 161.1 lb

## 2022-08-24 DIAGNOSIS — E1165 Type 2 diabetes mellitus with hyperglycemia: Secondary | ICD-10-CM | POA: Diagnosis not present

## 2022-08-24 MED ORDER — FREESTYLE LIBRE 3 SENSOR MISC: 2 refills | Status: DC

## 2022-08-24 MED ORDER — EMPAGLIFLOZIN 10 MG PO TABS: 10.0000 mg | ORAL_TABLET | Freq: Every day | ORAL | 1 refills | Status: DC

## 2022-08-24 NOTE — Progress Notes (Signed)
I,[name],acting as a Education administrator for [employer], PA-C.,have documented all relevant documentation on the behalf of Michelle Kirschner, PA-C,as directed by  Michelle Kirschner, PA-C while in the presence of Michelle Kirschner, PA-C.   Established patient visit   Patient: Michelle [facility]ricks   DOB: Jan 19, 1966   56 y.o. Female  MRN: [id number] Visit Date: 08/24/2022  Today's healthcare provider: [name], PA-C   Cc. Dm II  Subjective    HPI   Diabetes Mellitus Type II, Follow-up  Lab Results  Component Value Date   HGBA1C 9.7 (H) 07/09/2022   HGBA1C 8.9 (A) 04/25/2022   HGBA1C 10.0 (H) 03/23/2022   Wt Readings from Last 3 Encounters:  08/24/22 161 lb 1.6 oz (73.1 kg)  07/11/22 162 lb 1.6 oz (73.5 kg)  07/08/22 161 lb 4.8 oz (73.2 kg)   Last seen for diabetes 6 weeks ago.  Management since then includes continue current treatment and be more consistent. She reports excellent compliance with treatment. She is not having side effects. Symptoms: Yes fatigue No foot ulcerations  No appetite changes No nausea  No paresthesia of the feet  No polydipsia  No polyuria No visual disturbances   No vomiting     Home blood sugar records:  --average daily 140s-150s, fasting 170s-190s.   Episodes of hypoglycemia? No    Current insulin regiment: none Most Recent Eye Exam: Need to update Current exercise: treadmill Current diet habits: well balanced  Pertinent Labs: Lab Results  Component Value Date   CHOL 181 03/23/2022   HDL 37 (L) 03/23/2022   LDLCALC 70 03/23/2022   TRIG 476 (H) 03/23/2022   CHOLHDL 4.9 (H) 03/23/2022   Lab Results  Component Value Date   NA 142 07/24/2022   K 4.5 07/24/2022   CREATININE 0.68 07/24/2022   EGFR 103 07/24/2022   MICROALBUR 41 10/04/2017   LABMICR <3.0 04/25/2022     ---------------------------------------------------------------------------------------------------   Medications: Outpatient Medications Prior to Visit  Medication  Sig   citalopram (CELEXA) 40 MG tablet Dose increased to 58m. Has meds for now   lamoTRIgine (LAMICTAL) 25 MG tablet TAKE 1 TABLET (25 MG TOTAL) BY MOUTH DAILY.   lisinopril (ZESTRIL) 10 MG tablet Take 1 tablet (10 mg total) by mouth daily.   LORazepam (ATIVAN) 0.5 MG tablet Take 1 tablet (0.5 mg total) by mouth daily as needed for anxiety (panic attack).   saxagliptin HCl (ONGLYZA) 5 MG TABS tablet Take 1 tablet (5 mg total) by mouth daily.   simvastatin (ZOCOR) 20 MG tablet Take 1 tablet (20 mg total) by mouth at bedtime.   No facility-administered medications prior to visit.    Review of Systems  Constitutional:  Negative for fatigue and fever.  Respiratory:  Negative for cough and shortness of breath.   Cardiovascular:  Negative for chest pain and leg swelling.  Gastrointestinal:  Negative for abdominal pain.  Neurological:  Negative for dizziness and headaches.      Objective    BP 125/68 (BP Location: Right Arm, Patient Position: Sitting, Cuff Size: Normal)   Pulse 92   Ht _0  (1.575 m)   Wt 161 lb 1.6 oz (73.1 kg)   LMP 04/16/2015   SpO2 98%   BMI 29.47 kg/m  Blood pressure 125/68, pulse 92, height _1  (1.575 m), weight 161 lb 1.6 oz (73.1 kg), last menstrual period 04/16/2015, SpO2 98 %.   Physical Exam Vitals reviewed.  Constitutional:      Appearance: She is  not ill-appearing.  HENT:     Head: Normocephalic.  Eyes:     Conjunctiva/sclera: Conjunctivae normal.  Cardiovascular:     Rate and Rhythm: Normal rate.     Pulses:          Dorsalis pedis pulses are 3+ on the right side and 3+ on the left side.       Posterior tibial pulses are 3+ on the right side and 3+ on the left side.  Pulmonary:     Effort: Pulmonary effort is normal. No respiratory distress.  Feet:     Right foot:     Protective Sensation: 4 sites tested.  4 sites sensed.     Skin integrity: Skin integrity normal.     Toenail Condition: Right toenails are normal.     Left foot:      Protective Sensation: 4 sites tested.  4 sites sensed.     Skin integrity: Skin integrity normal.     Toenail Condition: Left toenails are normal.  Neurological:     General: No focal deficit present.     Mental Status: She is alert and oriented to person, place, and time.  Psychiatric:        Mood and Affect: Mood normal.        Behavior: Behavior normal.     No results found for any visits on 08/24/22.  Assessment & Plan     Problem List Items Addressed This Visit       Endocrine   Type 2 diabetes mellitus with hyperglycemia ([place]) - Primary    Pt is compliant w/ meds and checking sugar frequently. congradulated on effort. Advised if her insurance covers, may benefit from freestyle monitor, ordered. Discussed continuing w/ lifestyle changes and saxaglipitn vs adding jardiance-- pt would like to add second medication already and continue to monitor sugars. Again advised smoking cessation Foot exam completed today, uacr utd needs optho On statin      Relevant Medications   empagliflozin (JARDIANCE) 10 MG TABS tablet   Continuous Blood Gluc Sensor (FREESTYLE LIBRE 3 SENSOR) MISC     Return in about 6 weeks (around 10/05/2022) for DMII.      I, Michelle Kirschner, PA-C have reviewed all documentation for this visit. The documentation on  08/24/2022 for the exam, diagnosis, procedures, and orders are all accurate and complete.  Michelle Kirschner, PA-C [facility] [address] [place], [place], [address] Office: [phone] Fax: [place]

## 2022-08-24 NOTE — Assessment & Plan Note (Signed)
Pt is compliant w/ meds and checking sugar frequently. congradulated on effort. Advised if her insurance covers, may benefit from freestyle monitor, ordered. Discussed continuing w/ lifestyle changes and saxaglipitn vs adding jardiance-- pt would like to add second medication already and continue to monitor sugars. Again advised smoking cessation Foot exam completed today, uacr utd needs optho On statin

## 2022-08-30 ENCOUNTER — Telehealth: Payer: Self-pay | Admitting: Physician Assistant

## 2022-08-30 NOTE — Telephone Encounter (Signed)
The cost with pts insurance is $124 for the Continuous Blood Gluc Sensor (FREESTYLE LIBRE 3 SENSOR) MISC / pt would like to know if there is a lower cost option / pt will check with her insurance to see which one they may cover / please advise

## 2022-09-10 NOTE — Telephone Encounter (Signed)
noted 

## 2022-09-13 ENCOUNTER — Ambulatory Visit (HOSPITAL_COMMUNITY): Payer: 59 | Admitting: Psychiatry

## 2022-09-13 ENCOUNTER — Encounter (HOSPITAL_COMMUNITY): Payer: Self-pay | Admitting: Psychiatry

## 2022-09-13 VITALS — BP 130/82 | HR 102 | Temp 98.2°F | Ht 62.0 in | Wt 159.0 lb

## 2022-09-13 DIAGNOSIS — F419 Anxiety disorder, unspecified: Secondary | ICD-10-CM

## 2022-09-13 DIAGNOSIS — F321 Major depressive disorder, single episode, moderate: Secondary | ICD-10-CM | POA: Diagnosis not present

## 2022-09-13 DIAGNOSIS — F32A Depression, unspecified: Secondary | ICD-10-CM

## 2022-09-13 DIAGNOSIS — F411 Generalized anxiety disorder: Secondary | ICD-10-CM

## 2022-09-13 MED ORDER — LORAZEPAM 0.5 MG PO TABS: 0.5000 mg | ORAL_TABLET | Freq: Every day | ORAL | 0 refills | Status: DC | PRN

## 2022-09-13 MED ORDER — LAMOTRIGINE 25 MG PO TABS: 50.0000 mg | ORAL_TABLET | Freq: Every day | ORAL | 1 refills | Status: DC

## 2022-09-13 MED ORDER — CITALOPRAM HYDROBROMIDE 40 MG PO TABS: ORAL_TABLET | ORAL | 0 refills | Status: DC
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
## 2022-09-16 ENCOUNTER — Other Ambulatory Visit: Payer: Self-pay | Admitting: Physician Assistant

## 2022-09-16 DIAGNOSIS — I10 Essential (primary) hypertension: Secondary | ICD-10-CM

## 2022-10-05 ENCOUNTER — Other Ambulatory Visit (HOSPITAL_COMMUNITY): Payer: Self-pay | Admitting: Psychiatry

## 2022-10-10 ENCOUNTER — Encounter: Payer: Self-pay | Admitting: Physician Assistant

## 2022-10-10 ENCOUNTER — Ambulatory Visit: Payer: 59 | Admitting: Physician Assistant

## 2022-10-10 VITALS — BP 121/76 | HR 99 | Temp 99.2°F | Wt 160.0 lb

## 2022-10-10 DIAGNOSIS — M5442 Lumbago with sciatica, left side: Secondary | ICD-10-CM

## 2022-10-10 MED ORDER — CELECOXIB 100 MG PO CAPS: 100.0000 mg | ORAL_CAPSULE | Freq: Two times a day (BID) | ORAL | 0 refills | Status: DC

## 2022-10-10 MED ORDER — BACLOFEN 10 MG PO TABS: 10.0000 mg | ORAL_TABLET | Freq: Three times a day (TID) | ORAL | 0 refills | Status: DC

## 2022-10-10 NOTE — Progress Notes (Signed)
I,Roshena L Chambers,acting as a scribe for [employer], PA-C.,have documented all relevant documentation on the behalf of Alfredia Ferguson, PA-C,as directed by  Alfredia Ferguson, PA-C while in the presence of Alfredia Ferguson, PA-C.   Established patient visit   Patient: Michelle Hendricks   DOB: 12-Jan-1966   56 y.o. Female  MRN: [id number] Visit Date: 10/10/2022  Today's healthcare provider: Alfredia Ferguson, PA-C   Chief Complaint  Patient presents with   Back Pain   Subjective    Back Pain This is a new problem. Episode onset: 2 days ago. The problem has been gradually worsening since onset. The pain is present in the lumbar spine. Radiates to: left leg. The symptoms are aggravated by lying down, sitting and standing. Pertinent negatives include no abdominal pain, chest pain, fever or weakness. She has tried NSAIDs and heat (also tried Tylenol) for the symptoms.    Denies injury, started when driving to work.  Medications: Outpatient Medications Prior to Visit  Medication Sig   citalopram (CELEXA) 40 MG tablet Dose increased to 40mg . Has meds for now   empagliflozin (JARDIANCE) 10 MG TABS tablet Take 1 tablet (10 mg total) by mouth daily before breakfast.   lamoTRIgine (LAMICTAL) 25 MG tablet TAKE 2 TABLETS BY MOUTH EVERY DAY   lisinopril (ZESTRIL) 10 MG tablet TAKE 1 TABLET BY MOUTH EVERY DAY   LORazepam (ATIVAN) 0.5 MG tablet Take 1 tablet (0.5 mg total) by mouth daily as needed for anxiety (panic attack).   saxagliptin HCl (ONGLYZA) 5 MG TABS tablet Take 1 tablet (5 mg total) by mouth daily.   simvastatin (ZOCOR) 20 MG tablet Take 1 tablet (20 mg total) by mouth at bedtime.   [DISCONTINUED] Continuous Blood Gluc Sensor (FREESTYLE LIBRE 3 SENSOR) MISC Place 1 sensor on the skin every 14 days. Use to check glucose continuously   No facility-administered medications prior to visit.    Review of Systems  Constitutional:  Negative for appetite change, chills, fatigue and fever.   Respiratory:  Negative for chest tightness and shortness of breath.   Cardiovascular:  Negative for chest pain and palpitations.  Gastrointestinal:  Negative for abdominal pain, nausea and vomiting.  Musculoskeletal:  Positive for back pain.  Neurological:  Negative for dizziness and weakness.    Objective    BP 121/76 (BP Location: Right Arm, Patient Position: Sitting, Cuff Size: Normal)   Pulse 99   Temp 99.2 F (37.3 C) (Oral)   Wt 160 lb (72.6 kg)   LMP 04/16/2015   SpO2 98% Comment: room air  BMI 29.26 kg/m   Physical Exam Vitals reviewed.  Constitutional:      Appearance: She is not ill-appearing.  HENT:     Head: Normocephalic.  Eyes:     Conjunctiva/sclera: Conjunctivae normal.  Cardiovascular:     Rate and Rhythm: Normal rate.  Pulmonary:     Effort: Pulmonary effort is normal. No respiratory distress.  Musculoskeletal:     Comments: No edema or rash present.  Tenderness to L paraspinal muscles lumbar back  Neurological:     General: No focal deficit present.     Mental Status: She is alert and oriented to person, place, and time.  Psychiatric:        Mood and Affect: Mood normal.        Behavior: Behavior normal.      No results found for any visits on 10/10/22.  Assessment & Plan     Low back pain  Advised heat/ice, movement, stretching Rx baclofen up to TID Rx celebrex BID x 7 days  Mutual decision not to treat with steroids given uncontrolled DM  Return if symptoms worsen or fail to improve.     I, Alfredia Ferguson, PA-C have reviewed all documentation for this visit. The documentation on  10/10/2022   for the exam, diagnosis, procedures, and orders are all accurate and complete.  Alfredia Ferguson, PA-C [facility] [address] [place], [place], [address] Office: [phone] Fax: [phone]   [facility]

## 2022-10-12 ENCOUNTER — Ambulatory Visit: Payer: 59 | Admitting: Physician Assistant

## 2022-10-21 ENCOUNTER — Other Ambulatory Visit: Payer: Self-pay | Admitting: Physician Assistant

## 2022-10-21 DIAGNOSIS — F32A Depression, unspecified: Secondary | ICD-10-CM

## 2022-10-21 DIAGNOSIS — F419 Anxiety disorder, unspecified: Secondary | ICD-10-CM

## 2022-10-24 ENCOUNTER — Other Ambulatory Visit (HOSPITAL_COMMUNITY): Payer: Self-pay

## 2022-10-24 DIAGNOSIS — F32A Depression, unspecified: Secondary | ICD-10-CM

## 2022-10-24 DIAGNOSIS — F419 Anxiety disorder, unspecified: Secondary | ICD-10-CM

## 2022-10-24 MED ORDER — CITALOPRAM HYDROBROMIDE 40 MG PO TABS: ORAL_TABLET | ORAL | 0 refills | Status: DC

## 2022-11-13 ENCOUNTER — Encounter (HOSPITAL_COMMUNITY): Payer: Self-pay | Admitting: Psychiatry

## 2022-11-13 ENCOUNTER — Ambulatory Visit (HOSPITAL_COMMUNITY): Payer: 59 | Admitting: Psychiatry

## 2022-11-13 VITALS — BP 124/72 | HR 90 | Temp 98.3°F | Ht 62.5 in | Wt 167.0 lb

## 2022-11-13 DIAGNOSIS — F321 Major depressive disorder, single episode, moderate: Secondary | ICD-10-CM | POA: Diagnosis not present

## 2022-11-13 DIAGNOSIS — R4589 Other symptoms and signs involving emotional state: Secondary | ICD-10-CM

## 2022-11-13 DIAGNOSIS — F32A Depression, unspecified: Secondary | ICD-10-CM

## 2022-11-13 DIAGNOSIS — F419 Anxiety disorder, unspecified: Secondary | ICD-10-CM | POA: Diagnosis not present

## 2022-11-13 DIAGNOSIS — F411 Generalized anxiety disorder: Secondary | ICD-10-CM

## 2022-11-13 MED ORDER — CITALOPRAM HYDROBROMIDE 40 MG PO TABS: ORAL_TABLET | ORAL | 0 refills | Status: DC
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
## 2022-11-20 ENCOUNTER — Other Ambulatory Visit (HOSPITAL_COMMUNITY): Payer: Self-pay | Admitting: Psychiatry

## 2022-11-20 DIAGNOSIS — F419 Anxiety disorder, unspecified: Secondary | ICD-10-CM

## 2022-11-20 DIAGNOSIS — F32A Depression, unspecified: Secondary | ICD-10-CM

## 2022-11-22 ENCOUNTER — Ambulatory Visit: Payer: Self-pay | Admitting: *Deleted

## 2022-11-22 NOTE — Telephone Encounter (Signed)
Message from Arther Dames sent at 11/22/2022  8:30 AM EST  Summary: Covid+   Patient states that she tested positive for covid on a home test this morning. Patient states the only symptoms she has is congestion. Patient is seeking advice.          Call History   Type Contact Phone/Fax User  11/22/2022 08:28 AM EST Phone (Incoming) Jennfier, Abdulla (Self) [phone] (H) Mabe, Danielle E   Reason for Disposition  [1] HIGH RISK patient (e.g., weak immune system, age > 64 years, obesity with BMI 30 or higher, pregnant, chronic lung disease or other chronic medical condition) AND [2] COVID symptoms (e.g., cough, fever)  (Exceptions: Already seen by PCP and no new or worsening symptoms.)    Has diabetes  Answer Assessment - Initial Assessment Questions 1. COVID-19 DIAGNOSIS: "How do you know that you have COVID?" (e.g., positive lab test or self-test, diagnosed by doctor or NP/PA, symptoms after exposure).     Positive test this morning 2. COVID-19 EXPOSURE: "Was there any known exposure to COVID before the symptoms began?" CDC Definition of close contact: within 6 feet (2 meters) for a total of 15 minutes or more over a 24-hour period.      It's going around at my job. 3. ONSET: "When did the COVID-19 symptoms start?"      Congestion, sinus congestion, runny nose  no sore throat or coughing 4. WORST SYMPTOM: "What is your worst symptom?" (e.g., cough, fever, shortness of breath, muscle aches)     N/A 5. COUGH: "Do you have a cough?" If Yes, ask: "How bad is the cough?"       No 6. FEVER: "Do you have a fever?" If Yes, ask: "What is your temperature, how was it measured, and when did it start?"     No 7. RESPIRATORY STATUS: "Describe your breathing?" (e.g., normal; shortness of breath, wheezing, unable to speak)      No 8. BETTER-SAME-WORSE: "Are you getting better, staying the same or getting worse compared to yesterday?"  If getting worse, ask, "In what way?"     Not asked 9. OTHER  SYMPTOMS: "Do you have any other symptoms?"  (e.g., chills, fatigue, headache, loss of smell or taste, muscle pain, sore throat)     Tired and nasal congestion  10. HIGH RISK DISEASE: "Do you have any chronic medical problems?" (e.g., asthma, heart or lung disease, weak immune system, obesity, etc.)       Diabetes  11. VACCINE: "Have you had the COVID-19 vaccine?" If Yes, ask: "Which one, how many shots, when did you get it?"       Not asked 12. PREGNANCY: "Is there any chance you are pregnant?" "When was your last menstrual period?"       N/A 13. O2 SATURATION MONITOR:  "Do you use an oxygen saturation monitor (pulse oximeter) at home?" If Yes, ask "What is your reading (oxygen level) today?" "What is your usual oxygen saturation reading?" (e.g., 95%)       N/A  Protocols used: Coronavirus (COVID-19) Diagnosed or Suspected-A-AH  Chief Complaint: Positive home Covid test.   I have chronic sinus problems.   I tested by chance and came up positive.   Symptoms: congestion, runny nose tired Frequency: Not sure due to chronic sinus problems. Pertinent Negatives: Patient denies Fever, body aches, chills, no shortness of breath Disposition: [] ED /[] Urgent Care (no appt availability in office) / [x] Appointment(In office/virtual)/ []   Virtual Care/ [] Home Care/ []   Refused Recommended Disposition /[] [facility]/ []  Follow-up with PCP Additional Notes: Went over care advice for Covid.   Appt. Made with , PA-C for 11/23/2022.   Instructed pt. To wear a mask to appt.    Go to the ED/urgent care if chest tightness or shortness of breath occurs.

## 2022-11-23 ENCOUNTER — Telehealth (INDEPENDENT_AMBULATORY_CARE_PROVIDER_SITE_OTHER): Payer: Self-pay | Admitting: Physician Assistant

## 2022-11-23 ENCOUNTER — Encounter: Payer: Self-pay | Admitting: Physician Assistant

## 2022-11-23 VITALS — Temp 98.8°F

## 2022-11-23 DIAGNOSIS — U071 COVID-19: Secondary | ICD-10-CM

## 2022-11-23 MED ORDER — MOLNUPIRAVIR EUA 200MG CAPSULE: 4.0000 | ORAL_CAPSULE | Freq: Two times a day (BID) | ORAL | 0 refills | Status: AC

## 2022-11-23 NOTE — Progress Notes (Signed)
I,Joseline E Rosas,acting as a scribe for [employer], PA-C.,have documented all relevant documentation on the behalf of Alfredia Ferguson, PA-C,as directed by  Alfredia Ferguson, PA-C while in the presence of Alfredia Ferguson, PA-C.  MyChart Video Visit    Virtual Visit via Video Note   This format is felt to be most appropriate for this patient at this time. Physical exam was limited by quality of the video and audio technology used for the visit.   Patient location: home Provider location: [facility]  I discussed the limitations of evaluation and management by telemedicine and the availability of in person appointments. The patient expressed understanding and agreed to proceed.  Patient: Michelle Hendricks   DOB: Oct 12, 1966   56 y.o. Female  MRN: [id number] Visit Date: 11/23/2022  Today's healthcare provider: Alfredia Ferguson, PA-C   Chief Complaint  Patient presents with   Covid Positive   Subjective    HPI  Covid Positive  Patient states that she tested positive for covid on a home test Wednesday morning. Current symptoms fever, muscle aches, congestion, fatigue. Denies shortness of breath, wheezing.  She is requesting antiviral medication.   Medications: Outpatient Medications Prior to Visit  Medication Sig   celecoxib (CELEBREX) 100 MG capsule Take 1 capsule (100 mg total) by mouth 2 (two) times daily.   citalopram (CELEXA) 40 MG tablet DOSE INCREASED TO 40MG . HAS MEDS FOR NOW   empagliflozin (JARDIANCE) 10 MG TABS tablet Take 1 tablet (10 mg total) by mouth daily before breakfast.   lamoTRIgine (LAMICTAL) 25 MG tablet TAKE 2 TABLETS BY MOUTH EVERY DAY   lisinopril (ZESTRIL) 10 MG tablet TAKE 1 TABLET BY MOUTH EVERY DAY   LORazepam (ATIVAN) 0.5 MG tablet Take 1 tablet (0.5 mg total) by mouth daily as needed for anxiety (panic attack).   saxagliptin HCl (ONGLYZA) 5 MG TABS tablet Take 1 tablet (5 mg total) by mouth daily.   simvastatin (ZOCOR) 20 MG tablet Take 1 tablet (20 mg total)  by mouth at bedtime.   baclofen (LIORESAL) 10 MG tablet Take 1 tablet (10 mg total) by mouth 3 (three) times daily.   No facility-administered medications prior to visit.    Review of Systems  Constitutional:  Positive for fatigue and fever.  HENT:  Positive for congestion, postnasal drip, rhinorrhea and sore throat.   Respiratory:  Positive for cough. Negative for shortness of breath.   Cardiovascular:  Negative for chest pain and leg swelling.  Gastrointestinal:  Negative for abdominal pain.  Neurological:  Negative for dizziness and headaches.      Objective    Temp 98.8 F (37.1 C) Comment: pt took temp on video at home  LMP 04/16/2015    Physical Exam Constitutional:      Appearance: She is not ill-appearing or toxic-appearing.  Neurological:     Mental Status: She is oriented to person, place, and time.  Psychiatric:        Mood and Affect: Mood normal.        Behavior: Behavior normal.        Assessment & Plan     COVID 19 Pt is eligible for antiviral given DM, smoker Rx molnupiravir x 5 days  advised SE increase fluids ,mucinex, tylenol If symptoms worsen or any sob or wheezing return to office  Return if symptoms worsen or fail to improve.     I discussed the assessment and treatment plan with the patient. The patient was provided an opportunity to ask questions  and all were answered. The patient agreed with the plan and demonstrated an understanding of the instructions.   The patient was advised to call back or seek an in-person evaluation if the symptoms worsen or if the condition fails to improve as anticipated.  I provided 10 minutes of non-face-to-face time during this encounter. I, Alfredia Ferguson, PA-C have reviewed all documentation for this visit. The documentation on 11/23/2022 for the exam, diagnosis, procedures, and orders are all accurate and complete.  Alfredia Ferguson, PA-C [facility] [address] [place],  [place], [address] Office: [phone] Fax: [phone]   [facility]

## 2023-01-04 ENCOUNTER — Other Ambulatory Visit (HOSPITAL_COMMUNITY): Payer: Self-pay | Admitting: Psychiatry

## 2023-01-04 ENCOUNTER — Encounter: Payer: Self-pay | Admitting: Physician Assistant

## 2023-01-08 ENCOUNTER — Other Ambulatory Visit: Payer: Self-pay | Admitting: Physician Assistant

## 2023-01-08 DIAGNOSIS — E1165 Type 2 diabetes mellitus with hyperglycemia: Secondary | ICD-10-CM

## 2023-01-11 ENCOUNTER — Ambulatory Visit
Admission: RE | Admit: 2023-01-11 | Discharge: 2023-01-11 | Disposition: A | Discharge status: Home / Self Care | Payer: 59 | Source: Ambulatory Visit | Attending: Student | Admitting: Student

## 2023-01-11 ENCOUNTER — Other Ambulatory Visit: Payer: Self-pay | Admitting: Student

## 2023-01-11 DIAGNOSIS — M79662 Pain in left lower leg: Secondary | ICD-10-CM | POA: Insufficient documentation

## 2023-01-29 ENCOUNTER — Other Ambulatory Visit: Payer: Self-pay | Admitting: Physician Assistant

## 2023-01-29 DIAGNOSIS — E1165 Type 2 diabetes mellitus with hyperglycemia: Secondary | ICD-10-CM

## 2023-02-14 ENCOUNTER — Ambulatory Visit (HOSPITAL_COMMUNITY): Payer: 59 | Admitting: Psychiatry

## 2023-02-16 ENCOUNTER — Other Ambulatory Visit (HOSPITAL_COMMUNITY): Payer: Self-pay | Admitting: Psychiatry

## 2023-02-16 DIAGNOSIS — F32A Depression, unspecified: Secondary | ICD-10-CM

## 2023-02-16 DIAGNOSIS — F419 Anxiety disorder, unspecified: Secondary | ICD-10-CM

## 2023-02-20 LAB — HM DIABETES EYE EXAM

## 2023-03-05 ENCOUNTER — Ambulatory Visit (HOSPITAL_COMMUNITY): Payer: 59 | Admitting: Psychiatry

## 2023-03-24 ENCOUNTER — Other Ambulatory Visit: Payer: Self-pay | Admitting: Physician Assistant

## 2023-03-24 DIAGNOSIS — I10 Essential (primary) hypertension: Secondary | ICD-10-CM

## 2023-04-05 ENCOUNTER — Other Ambulatory Visit (HOSPITAL_COMMUNITY): Payer: Self-pay | Admitting: Psychiatry

## 2023-04-08 ENCOUNTER — Other Ambulatory Visit: Payer: Self-pay | Admitting: Physician Assistant

## 2023-04-08 ENCOUNTER — Telehealth: Payer: Self-pay

## 2023-04-08 DIAGNOSIS — E1165 Type 2 diabetes mellitus with hyperglycemia: Secondary | ICD-10-CM

## 2023-04-08 MED ORDER — SAXAGLIPTIN HCL 5 MG PO TABS: 5.0000 mg | ORAL_TABLET | Freq: Every day | ORAL | 1 refills | Status: DC

## 2023-04-08 NOTE — Telephone Encounter (Signed)
Medication is covered. And has a co-pay of $75 for 90 days. Patient advised.

## 2023-04-08 NOTE — Telephone Encounter (Signed)
Copied from CRM [id number]. Topic: General - Inquiry >> Apr 08, 2023 10:54 AM Haroldine Laws wrote: Reason for CRM: pt called asking if lindsay Drubel still wants her to take the Saxagliptin.  She said the pharmacy is saying her insurance will not cover and I don't see it on her current list.  CB#  [phone]

## 2023-04-09 ENCOUNTER — Ambulatory Visit (HOSPITAL_COMMUNITY): Payer: 59 | Admitting: Psychiatry

## 2023-04-09 ENCOUNTER — Encounter (HOSPITAL_COMMUNITY): Payer: Self-pay | Admitting: Psychiatry

## 2023-04-09 VITALS — BP 118/76 | HR 83 | Temp 98.2°F | Ht 61.75 in | Wt 161.0 lb

## 2023-04-09 DIAGNOSIS — F321 Major depressive disorder, single episode, moderate: Secondary | ICD-10-CM | POA: Diagnosis not present

## 2023-04-09 DIAGNOSIS — F411 Generalized anxiety disorder: Secondary | ICD-10-CM

## 2023-04-09 DIAGNOSIS — F419 Anxiety disorder, unspecified: Secondary | ICD-10-CM | POA: Diagnosis not present

## 2023-04-09 MED ORDER — LAMOTRIGINE 25 MG PO TABS
50.0000 mg | ORAL_TABLET | Freq: Every day | ORAL | 0 refills | Status: DC
Start: 2023-04-09 — End: 2023-07-05
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
## 2023-04-27 ENCOUNTER — Other Ambulatory Visit: Payer: Self-pay | Admitting: Physician Assistant

## 2023-04-27 DIAGNOSIS — E1165 Type 2 diabetes mellitus with hyperglycemia: Secondary | ICD-10-CM

## 2023-05-14 ENCOUNTER — Ambulatory Visit: Payer: 59 | Admitting: Family Medicine

## 2023-05-14 ENCOUNTER — Other Ambulatory Visit (HOSPITAL_COMMUNITY): Payer: Self-pay | Admitting: Psychiatry

## 2023-05-14 VITALS — BP 133/85 | HR 98 | Temp 98.6°F | Wt 157.0 lb

## 2023-05-14 DIAGNOSIS — F32A Depression, unspecified: Secondary | ICD-10-CM

## 2023-05-14 DIAGNOSIS — Z599 Problem related to housing and economic circumstances, unspecified: Secondary | ICD-10-CM | POA: Diagnosis not present

## 2023-05-14 DIAGNOSIS — Z794 Long term (current) use of insulin: Secondary | ICD-10-CM | POA: Diagnosis not present

## 2023-05-14 DIAGNOSIS — E1165 Type 2 diabetes mellitus with hyperglycemia: Secondary | ICD-10-CM

## 2023-05-14 DIAGNOSIS — N951 Menopausal and female climacteric states: Secondary | ICD-10-CM

## 2023-05-14 DIAGNOSIS — F419 Anxiety disorder, unspecified: Secondary | ICD-10-CM

## 2023-05-14 MED ORDER — BLOOD GLUCOSE TEST VI STRP: 1.0000 | ORAL_STRIP | Freq: Three times a day (TID) | 2 refills | Status: AC

## 2023-05-14 MED ORDER — CLONIDINE HCL 0.1 MG PO TABS: 0.1000 mg | ORAL_TABLET | Freq: Every day | ORAL | 0 refills | Status: DC | PRN

## 2023-05-14 MED ORDER — LANCET DEVICE MISC: 1.0000 | Freq: Three times a day (TID) | 0 refills | Status: DC

## 2023-05-14 MED ORDER — METFORMIN HCL ER 500 MG PO TB24: 2000.0000 mg | ORAL_TABLET | Freq: Every day | ORAL | 0 refills | Status: DC

## 2023-05-14 MED ORDER — BLOOD GLUCOSE MONITORING SUPPL DEVI: 1.0000 | Freq: Three times a day (TID) | 0 refills | Status: DC

## 2023-05-14 MED ORDER — LANCETS MISC. MISC: 1.0000 | Freq: Three times a day (TID) | 2 refills | Status: DC

## 2023-05-14 NOTE — Progress Notes (Signed)
Established patient visit   Patient: Michelle Hendricks   DOB: [birth year]   57 y.o. Female  MRN: [id number] Visit Date: 05/14/2023  Today's healthcare provider: Sherlyn Hay, DO   Chief Complaint  Patient presents with   Diabetes Management Plan    Wants to discuss possible changes   Subjective    HPI HPI     Diabetes Management Plan    Additional comments: Wants to discuss possible changes      Last edited by Sherlyn Hay, DO on 05/17/2023  8:30 AM.      Diabetes Mellitus Type II, Follow-up  Lab Results  Component Value Date   HGBA1C 7.9 (H) 05/14/2023   HGBA1C 9.7 (H) 07/09/2022   HGBA1C 8.9 (A) 04/25/2022   Wt Readings from Last 3 Encounters:  05/14/23 157 lb (71.2 kg)  10/10/22 160 lb (72.6 kg)  08/24/22 161 lb 1.6 oz (73.1 kg)   Last seen for diabetes 9 months ago.  Management since then includes no changes. She reports fair compliance with treatment. She is not having side effects.   Symptoms: Yes fatigue No foot ulcerations  No appetite changes Yes nausea  No paresthesia of the feet  No polydipsia  No polyuria No visual disturbances   No vomiting     Home blood sugar records:  only checking once in a  while.  This morning it was 276 .  She notes that she has to pay out-of-pocket for her glucose strips and lancets, which is sometimes difficult to manage.  Episodes of hypoglycemia? No  Current insulin regiment: none  Patient states that her husband is out of work and she can no longer afford her medications.  She states that she took her last pills of her antihyperglycemic's this morning and would like to discuss a possible change to reduce the cost, at least until her husband can find a new job.  She drinks 3-4 bottles of water per day to try to reduce her blood glucoses.  She also complains that for the last day or two she has been having sweats and shakes. She does admit to drinking 2 "tall boys" on Saturday.  Denies any alcohol use during the  week. - She notes that she has sweating on and off but had to have three fans on in addition to the air conditioning last night and still sweat through her clothes.   - She states that normally she has episodes of hot flashes approximately once per week but that she has had them twice in the past week.  Prior to that, her frequency was her normal once per week.   Pertinent Labs: Lab Results  Component Value Date   CHOL 181 03/23/2022   HDL 37 (L) 03/23/2022   LDLCALC 70 03/23/2022   TRIG 476 (H) 03/23/2022   CHOLHDL 4.9 (H) 03/23/2022   Lab Results  Component Value Date   NA 142 07/24/2022   K 4.5 07/24/2022   CREATININE 0.68 07/24/2022   EGFR 103 07/24/2022   LABMICR <3.0 04/25/2022   MICRALBCREAT <5 04/25/2022     ---------------------------------------------------------------------------------------------------   Medications: Outpatient Medications Prior to Visit  Medication Sig   citalopram (CELEXA) 40 MG tablet TAKE 1 TABLET BY MOUTH EVERY DAY   empagliflozin (JARDIANCE) 10 MG TABS tablet TAKE 1 TABLET BY MOUTH EVERY DAY BEFORE BREAKFAST   lamoTRIgine (LAMICTAL) 25 MG tablet Take 2 tablets (50 mg total) by mouth daily.   lisinopril (ZESTRIL) 10 MG  tablet TAKE 1 TABLET BY MOUTH EVERY DAY   saxagliptin HCl (ONGLYZA) 5 MG TABS tablet Take 1 tablet (5 mg total) by mouth daily.   simvastatin (ZOCOR) 20 MG tablet Take 1 tablet (20 mg total) by mouth at bedtime.   [DISCONTINUED] LORazepam (ATIVAN) 0.5 MG tablet Take 1 tablet (0.5 mg total) by mouth daily as needed for anxiety (panic attack).   [DISCONTINUED] celecoxib (CELEBREX) 100 MG capsule Take 1 capsule (100 mg total) by mouth 2 (two) times daily. (Patient not taking: Reported on 05/14/2023)   No facility-administered medications prior to visit.    Review of Systems  Constitutional:  Positive for diaphoresis. Negative for activity change, appetite change, chills, fever and unexpected weight change.  Respiratory:  Negative  for cough, shortness of breath and wheezing.   Cardiovascular:  Negative for chest pain, palpitations and leg swelling.  Gastrointestinal:  Negative for abdominal pain, constipation, diarrhea, nausea and vomiting.  Genitourinary:  Negative for dysuria and frequency.  Neurological:  Positive for tremors. Negative for dizziness, light-headedness, numbness and headaches.       Objective    BP 133/85 (BP Location: Left Arm, Patient Position: Sitting, Cuff Size: Normal)   Pulse 98   Temp 98.6 F (37 C) (Oral)   Wt 157 lb (71.2 kg)   LMP 04/16/2015   SpO2 98%   BMI 28.95 kg/m    Physical Exam Vitals reviewed.  Constitutional:      General: She is not in acute distress.    Appearance: Normal appearance. She is well-developed. She is not diaphoretic.  HENT:     Head: Normocephalic and atraumatic.  Eyes:     General: No scleral icterus.    Conjunctiva/sclera: Conjunctivae normal.  Neck:     Thyroid: No thyromegaly.  Cardiovascular:     Rate and Rhythm: Normal rate and regular rhythm.     Pulses: Normal pulses.     Heart sounds: Normal heart sounds. No murmur heard. Pulmonary:     Effort: Pulmonary effort is normal. No respiratory distress.     Breath sounds: Normal breath sounds. No wheezing, rhonchi or rales.  Abdominal:     General: Bowel sounds are normal.  Musculoskeletal:     Cervical back: Neck supple.     Right lower leg: No edema.     Left lower leg: No edema.  Lymphadenopathy:     Cervical: No cervical adenopathy.  Skin:    General: Skin is warm and dry.     Findings: No rash.  Neurological:     Mental Status: She is alert and oriented to person, place, and time. Mental status is at baseline.  Psychiatric:        Mood and Affect: Mood normal.        Behavior: Behavior normal.      Results for orders placed or performed in visit on 05/14/23  Hemoglobin A1c  Result Value Ref Range   Hgb A1c MFr Bld 7.9 (H) 4.8 - 5.6 %   Est. average glucose Bld gHb  Est-mCnc 180 mg/dL    Assessment & Plan    1. Type 2 diabetes mellitus with hyperglycemia, with long-term current use of insulin (HCC) Spent 10-15 minutes counseling patient on the various options available to her to address her diabetes, as well as compared the costs associated with these and discussing previous effects of medications tried in the past. Insulin was ultimately not a financially viable option as it will cost as much as, if not more  than, her current medication regimen. We will stop her jardiance and continue her onglyza. She previously did have GI upset with metformin but does not appear to have tried the extended release version. Prescribed extended release metformin as noted below. Patient to contact office if she has issues with it. Will also attempt to order glucometer kit as noted below as there appears to be an option that is covered by her insurance (including strips and lancets), while her current glucometer's strips and lancets are not covered (Reli-On brand). Will also check an A1c as patient is due. - Blood Glucose Monitoring Suppl DEVI; 1 each by Does not apply route in the morning, at noon, and at bedtime. May substitute to any manufacturer covered by patient's insurance.  Dispense: 1 each; Refill: 0 - Glucose Blood (BLOOD GLUCOSE TEST STRIPS) STRP; 1 each by In Vitro route in the morning, at noon, and at bedtime. May substitute to any manufacturer covered by patient's insurance.  Dispense: 100 strip; Refill: 2 - Lancet Device MISC; 1 each by Does not apply route in the morning, at noon, and at bedtime. May substitute to any manufacturer covered by patient's insurance.  Dispense: 1 each; Refill: 0 - Lancets Misc. MISC; 1 each by Does not apply route in the morning, at noon, and at bedtime. May substitute to any manufacturer covered by patient's insurance.  Dispense: 100 each; Refill: 2 - metFORMIN (GLUCOPHAGE-XR) 500 MG 24 hr tablet; Take 4 tablets (2,000 mg total) by mouth  daily with breakfast. Start with one tablet daily and increase by 1 tab every week until you reach 4 tablets daily without GI upset.  Dispense: 360 tablet; Refill: 0 - Hemoglobin A1c  2. Vasomotor symptoms due to menopause Though patient normally has only one hot flash episode per week, discussed with her that it is not unreasonable for her to occasionally experience a second one, as she described today. Given the intermittent nature and her description of her symptoms, I do suspect it is related to her menopause. Discussed that hormone therapy is not a viable option due to her tobacco use but that we can try low dose clonidine as noted below. Patient was amenable to this treatment plan. - cloNIDine (CATAPRES) 0.1 MG tablet; Take 1 tablet (0.1 mg total) by mouth daily as needed (hot flashes).  Dispense: 90 tablet; Refill: 0   Return in about 4 weeks (around 06/11/2023) for DM.      The entirety of the information documented in the History of Present Illness, Review of Systems and Physical Exam were personally obtained by me. Portions of this information were initially documented by the CMA, Adline Peals, and reviewed by me for thoroughness and accuracy.   I discussed the assessment and treatment plan with the patient  The patient was provided an opportunity to ask questions and all were answered. The patient agreed with the plan and demonstrated an understanding of the instructions.   The patient was advised to call back or seek an in-person evaluation if the symptoms worsen or if the condition fails to improve as anticipated.    Sherlyn Hay, DO  [facility] [phone] (phone) [phone] (fax)  [facility]

## 2023-05-15 LAB — HEMOGLOBIN A1C
Est. average glucose Bld gHb Est-mCnc: 180 mg/dL
Hgb A1c MFr Bld: 7.9 % — ABNORMAL HIGH (ref 4.8–5.6)

## 2023-05-17 ENCOUNTER — Encounter: Payer: Self-pay | Admitting: Family Medicine

## 2023-06-20 ENCOUNTER — Other Ambulatory Visit: Payer: Self-pay | Admitting: Family Medicine

## 2023-06-20 ENCOUNTER — Other Ambulatory Visit: Payer: Self-pay | Admitting: Physician Assistant

## 2023-06-20 MED ORDER — SIMVASTATIN 40 MG PO TABS: 40.0000 mg | ORAL_TABLET | Freq: Every day | ORAL | 3 refills | Status: DC

## 2023-07-05 ENCOUNTER — Other Ambulatory Visit (HOSPITAL_COMMUNITY): Payer: Self-pay | Admitting: Psychiatry

## 2023-08-08 ENCOUNTER — Other Ambulatory Visit (HOSPITAL_COMMUNITY): Payer: Self-pay | Admitting: Psychiatry

## 2023-08-08 DIAGNOSIS — F32A Depression, unspecified: Secondary | ICD-10-CM

## 2023-08-08 DIAGNOSIS — F419 Anxiety disorder, unspecified: Secondary | ICD-10-CM

## 2023-08-14 ENCOUNTER — Encounter: Payer: Self-pay | Admitting: Family Medicine

## 2023-08-14 ENCOUNTER — Ambulatory Visit: Payer: 59 | Admitting: Family Medicine

## 2023-08-14 VITALS — BP 131/82 | HR 103 | Temp 98.6°F | Ht 61.0 in | Wt 161.0 lb

## 2023-08-14 DIAGNOSIS — J302 Other seasonal allergic rhinitis: Secondary | ICD-10-CM | POA: Insufficient documentation

## 2023-08-14 DIAGNOSIS — I10 Essential (primary) hypertension: Secondary | ICD-10-CM

## 2023-08-14 DIAGNOSIS — E1169 Type 2 diabetes mellitus with other specified complication: Secondary | ICD-10-CM

## 2023-08-14 DIAGNOSIS — E1165 Type 2 diabetes mellitus with hyperglycemia: Secondary | ICD-10-CM | POA: Diagnosis not present

## 2023-08-14 DIAGNOSIS — J3089 Other allergic rhinitis: Secondary | ICD-10-CM

## 2023-08-14 DIAGNOSIS — E785 Hyperlipidemia, unspecified: Secondary | ICD-10-CM

## 2023-08-14 DIAGNOSIS — Z7984 Long term (current) use of oral hypoglycemic drugs: Secondary | ICD-10-CM

## 2023-08-14 DIAGNOSIS — D582 Other hemoglobinopathies: Secondary | ICD-10-CM

## 2023-08-14 DIAGNOSIS — E559 Vitamin D deficiency, unspecified: Secondary | ICD-10-CM | POA: Insufficient documentation

## 2023-08-14 DIAGNOSIS — R7989 Other specified abnormal findings of blood chemistry: Secondary | ICD-10-CM | POA: Insufficient documentation

## 2023-08-14 MED ORDER — LEVOCETIRIZINE DIHYDROCHLORIDE 5 MG PO TABS: 5.0000 mg | ORAL_TABLET | Freq: Every evening | ORAL | 11 refills | Status: DC

## 2023-08-14 MED ORDER — LISINOPRIL 20 MG PO TABS
20.0000 mg | ORAL_TABLET | Freq: Every day | ORAL | 3 refills | Status: DC
Start: 2023-08-14 — End: 2024-10-28

## 2023-08-14 NOTE — Assessment & Plan Note (Signed)
Will recheck today.  No acute concerns.

## 2023-08-14 NOTE — Patient Instructions (Signed)
Sometimes insurance prefers you get vaccines at the pharmacy and will not cover it if done in the clinic.    - Please call your insurance to see if they will cover Tdap (tetanus, diphtheria and pertussis) and Shingrix (shingles) vaccines to make sure they will cover it before we administer it here.

## 2023-08-14 NOTE — Assessment & Plan Note (Signed)
Experiences incomplete control of her allergies with Zyrtec daily.  Will prescribe Xyzal as noted below in place of the Zyrtec and to reduce her reliance on Sudafed daily.

## 2023-08-14 NOTE — Assessment & Plan Note (Signed)
Patient has not been checking her blood sugars but has been trying to work up to 4 tablets of metformin daily.  She had to drop back down to 2 due to side effects and is now back up to 3 tablets daily. She is not able to afford the other medications that were previously prescribed.  Will refer her to pharmacy medication management to see what assistance they can offer her.

## 2023-08-14 NOTE — Progress Notes (Signed)
Established patient visit   Patient: Michelle Hendricks   DOB: [birth year]   57 y.o. Female  MRN: [id number] Visit Date: 08/14/2023  Today's healthcare provider: Sherlyn Hay, DO   Chief Complaint  Patient presents with   Diabetes   Subjective    HPI  Doing ok on 3 tab metformin  Has not been checking BGs.  Has increased smoking and eating due to stress  Allergies:  - Taking zyrtec daily  - also taking sudafed one 30 mg tablet daily during the weekdays  Exercise: walks a lot at work. Mammogram? Declined Colonoscopy? Declined Flu shot? Declined Eye exam? Done in March 2024 Recommend Tdap and shingles: Declined, will consider in future Last Pap? Doesn't remember last one.  All have been normal.  Declined today.      Medications: Outpatient Medications Prior to Visit  Medication Sig   Blood Glucose Monitoring Suppl DEVI 1 each by Does not apply route in the morning, at noon, and at bedtime. May substitute to any manufacturer covered by patient's insurance.   citalopram (CELEXA) 40 MG tablet TAKE 1 TABLET BY MOUTH EVERY DAY   Glucose Blood (BLOOD GLUCOSE TEST STRIPS) STRP 1 each by In Vitro route in the morning, at noon, and at bedtime. May substitute to any manufacturer covered by patient's insurance.   lamoTRIgine (LAMICTAL) 25 MG tablet TAKE 2 TABLETS BY MOUTH EVERY DAY   Lancet Device MISC 1 each by Does not apply route in the morning, at noon, and at bedtime. May substitute to any manufacturer covered by patient's insurance.   Lancets Misc. MISC 1 each by Does not apply route in the morning, at noon, and at bedtime. May substitute to any manufacturer covered by patient's insurance.   metFORMIN (GLUCOPHAGE-XR) 500 MG 24 hr tablet Take 4 tablets (2,000 mg total) by mouth daily with breakfast. Start with one tablet daily and increase by 1 tab every week until you reach 4 tablets daily without GI upset.   simvastatin (ZOCOR) 40 MG tablet Take 1 tablet (40 mg total) by  mouth at bedtime.   [DISCONTINUED] lisinopril (ZESTRIL) 10 MG tablet TAKE 1 TABLET BY MOUTH EVERY DAY   cloNIDine (CATAPRES) 0.1 MG tablet Take 1 tablet (0.1 mg total) by mouth daily as needed (hot flashes). (Patient not taking: Reported on 08/14/2023)   empagliflozin (JARDIANCE) 10 MG TABS tablet TAKE 1 TABLET BY MOUTH EVERY DAY BEFORE BREAKFAST (Patient not taking: Reported on 08/14/2023)   saxagliptin HCl (ONGLYZA) 5 MG TABS tablet Take 1 tablet (5 mg total) by mouth daily. (Patient not taking: Reported on 08/14/2023)   No facility-administered medications prior to visit.    Review of Systems  Constitutional:  Positive for appetite change (eating more due to stress). Negative for chills, fatigue and fever.  Eyes:  Negative for visual disturbance.  Respiratory:  Negative for chest tightness and shortness of breath.   Cardiovascular:  Negative for chest pain and palpitations.  Gastrointestinal:  Negative for abdominal pain, nausea and vomiting.  Endocrine: Positive for polyphagia. Negative for polydipsia and polyuria.  Neurological:  Negative for dizziness and weakness.          Objective    BP 131/82 (BP Location: Right Arm, Patient Position: Sitting, Cuff Size: Normal)   Pulse (!) 103   Temp 98.6 F (37 C) (Oral)   Ht 5\' 1"  (1.549 m)   Wt 161 lb (73 kg)   LMP 04/16/2015   SpO2 96%   BMI  30.42 kg/m     Physical Exam Vitals and nursing note reviewed.  Constitutional:      General: She is awake.     Appearance: Normal appearance.  HENT:     Head: Normocephalic and atraumatic.     Right Ear: Tympanic membrane, ear canal and external ear normal.     Left Ear: Tympanic membrane, ear canal and external ear normal.     Nose: Nose normal.     Mouth/Throat:     Mouth: Mucous membranes are moist.     Pharynx: Oropharynx is clear. No oropharyngeal exudate or posterior oropharyngeal erythema.  Eyes:     General: No scleral icterus.    Extraocular Movements: Extraocular  movements intact.     Conjunctiva/sclera: Conjunctivae normal.     Pupils: Pupils are equal, round, and reactive to light.  Neck:     Thyroid: No thyromegaly or thyroid tenderness.  Cardiovascular:     Rate and Rhythm: Normal rate and regular rhythm.     Pulses: Normal pulses.     Heart sounds: Normal heart sounds.  Pulmonary:     Effort: Pulmonary effort is normal. No tachypnea, bradypnea or respiratory distress.     Breath sounds: Normal breath sounds. No stridor. No wheezing, rhonchi or rales.  Abdominal:     General: Bowel sounds are normal. There is no distension.     Palpations: Abdomen is soft. There is no mass.     Tenderness: There is no abdominal tenderness. There is no guarding.     Hernia: No hernia is present.  Musculoskeletal:     Cervical back: Normal range of motion and neck supple.     Right lower leg: No edema.     Left lower leg: No edema.  Lymphadenopathy:     Cervical: No cervical adenopathy.  Skin:    General: Skin is warm and dry.     Comments: Sliced right thumb on a mandolin while cutting potatoes, currently wrapped in bandage which is clean, dry and intact.  Neurological:     Mental Status: She is alert and oriented to person, place, and time. Mental status is at baseline.  Psychiatric:        Mood and Affect: Mood normal.        Behavior: Behavior normal.      No results found for any visits on 08/14/23.  Assessment & Plan    Hyperlipidemia associated with type 2 diabetes mellitus (HCC) -     Hemoglobin A1c -     Lipid panel -     Vitamin B12  Vitamin D deficiency -     VITAMIN D 25 Hydroxy (Vit-D Deficiency, Fractures)  Primary hypertension Assessment & Plan: Increase lisinopril to 20 mg daily.  Patient will be taking 2 of her 10 mg tablets until she runs out and then fill the new prescription.  Orders: -     Microalbumin / creatinine urine ratio -     Comprehensive metabolic panel -     Lipid panel -     AMB Referral to Pharmacy  Medication Management -     Lisinopril; Take 1 tablet (20 mg total) by mouth daily.  Dispense: 90 tablet; Refill: 3  Perennial allergic rhinitis with seasonal variation Assessment & Plan: Experiences incomplete control of her allergies with Zyrtec daily.  Will prescribe Xyzal as noted below in place of the Zyrtec and to reduce her reliance on Sudafed daily.  Orders: -     Levocetirizine Dihydrochloride;  Take 1 tablet (5 mg total) by mouth every evening.  Dispense: 30 tablet; Refill: 11  Type 2 diabetes mellitus with hyperglycemia, without long-term current use of insulin (HCC) Assessment & Plan: Patient has not been checking her blood sugars but has been trying to work up to 4 tablets of metformin daily.  She had to drop back down to 2 due to side effects and is now back up to 3 tablets daily. She is not able to afford the other medications that were previously prescribed.  Will refer her to pharmacy medication management to see what assistance they can offer her.  Orders: -     AMB Referral to Pharmacy Medication Management  Elevated hemoglobin (HCC) Assessment & Plan: Will recheck today.  No acute concerns.  Orders: -     CBC  Abnormal serum thyroid stimulating hormone (TSH) level -     TSH Rfx on Abnormal to Free T4   Labs are NOT fasting today  Return in about 3 months (around 11/13/2023) for DM, HTN, Anx/Dep.      I discussed the assessment and treatment plan with the patient  The patient was provided an opportunity to ask questions and all were answered. The patient agreed with the plan and demonstrated an understanding of the instructions.   The patient was advised to call back or seek an in-person evaluation if the symptoms worsen or if the condition fails to improve as anticipated.    Sherlyn Hay, DO  [facility] [phone] (phone) [phone] (fax)  [facility]

## 2023-08-14 NOTE — Assessment & Plan Note (Signed)
Increase lisinopril to 20 mg daily.  Patient will be taking 2 of her 10 mg tablets until she runs out and then fill the new prescription.

## 2023-08-15 ENCOUNTER — Other Ambulatory Visit: Payer: Self-pay | Admitting: Family Medicine

## 2023-08-15 DIAGNOSIS — E781 Pure hyperglyceridemia: Secondary | ICD-10-CM

## 2023-08-16 LAB — HEMOGLOBIN A1C
Est. average glucose Bld gHb Est-mCnc: 206 mg/dL
Hgb A1c MFr Bld: 8.8 % — ABNORMAL HIGH (ref 4.8–5.6)

## 2023-08-16 LAB — MICROALBUMIN / CREATININE URINE RATIO
Creatinine, Urine: 112.9 mg/dL
Microalb/Creat Ratio: 19 mg/g{creat} (ref 0–29)
Microalbumin, Urine: 21.8 ug/mL

## 2023-08-16 LAB — LIPID PANEL
Chol/HDL Ratio: 9 ratio — ABNORMAL HIGH (ref 0.0–4.4)
Cholesterol, Total: 253 mg/dL — ABNORMAL HIGH (ref 100–199)
HDL: 28 mg/dL — ABNORMAL LOW (ref >39)
Triglycerides: 2002 mg/dL (ref 0–149)

## 2023-08-16 LAB — CBC
Hematocrit: 44.9 % (ref 34.0–46.6)
Hemoglobin: 15.7 g/dL (ref 11.1–15.9)
MCH: 32.2 pg (ref 26.6–33.0)
MCHC: 35 g/dL (ref 31.5–35.7)
MCV: 92 fL (ref 79–97)
Platelets: 308 10*3/uL (ref 150–450)
RBC: 4.87 x10E6/uL (ref 3.77–5.28)
RDW: 12.3 % (ref 11.7–15.4)
WBC: 9.9 10*3/uL (ref 3.4–10.8)

## 2023-08-16 LAB — COMPREHENSIVE METABOLIC PANEL
ALT: 19 IU/L (ref 0–32)
AST: 21 IU/L (ref 0–40)
Albumin: 4.6 g/dL (ref 3.8–4.9)
Alkaline Phosphatase: 96 IU/L (ref 44–121)
BUN/Creatinine Ratio: 23 (ref 9–23)
BUN: 13 mg/dL (ref 6–24)
Bilirubin Total: 0.2 mg/dL (ref 0.0–1.2)
CO2: 20 mmol/L (ref 20–29)
Calcium: 9.8 mg/dL (ref 8.7–10.2)
Chloride: 94 mmol/L — ABNORMAL LOW (ref 96–106)
Creatinine, Ser: 0.57 mg/dL (ref 0.57–1.00)
Globulin, Total: 2.6 g/dL (ref 1.5–4.5)
Glucose: 200 mg/dL — ABNORMAL HIGH (ref 70–99)
Potassium: 4.8 mmol/L (ref 3.5–5.2)
Sodium: 133 mmol/L — ABNORMAL LOW (ref 134–144)
Total Protein: 7.2 g/dL (ref 6.0–8.5)
eGFR: 107 mL/min/{1.73_m2} (ref >59)

## 2023-08-16 LAB — VITAMIN D 25 HYDROXY (VIT D DEFICIENCY, FRACTURES)

## 2023-08-16 LAB — VITAMIN B12: Vitamin B-12: 340 pg/mL (ref 232–1245)

## 2023-08-16 LAB — TSH RFX ON ABNORMAL TO FREE T4: TSH: 2.89 u[IU]/mL (ref 0.450–4.500)

## 2023-08-19 ENCOUNTER — Telehealth: Payer: Self-pay

## 2023-08-19 NOTE — Progress Notes (Signed)
Care Guide Note  08/19/2023 Name: [name] MRN: [id number] DOB: Apr 15, 1966  Referred by: Sherlyn Hay, DO Reason for referral : Care Coordination (Outreach to schedule with Pharm d )   Michelle Hendricks is a 57 y.o. year old female who is a primary care patient of Sherlyn Hay, DO. [name] was referred to the pharmacist for assistance related to HLD and DM.    Successful contact was made with the patient to discuss pharmacy services including being ready for the pharmacist to call at least 5 minutes before the scheduled appointment time, to have medication bottles and any blood sugar or blood pressure readings ready for review. The patient agreed to meet with the pharmacist via with the pharmacist via telephone visit on (date/time).  08/20/2023  Penne Lash, RMA Care Guide [facility]  [place], [place] [address] Direct Dial: [phone] [email or web address]

## 2023-08-20 ENCOUNTER — Other Ambulatory Visit: Payer: 59 | Admitting: Pharmacist

## 2023-08-20 DIAGNOSIS — E781 Pure hyperglyceridemia: Secondary | ICD-10-CM

## 2023-08-20 LAB — LIPID PANEL
Chol/HDL Ratio: 6.6 ratio — ABNORMAL HIGH (ref 0.0–4.4)
Cholesterol, Total: 206 mg/dL — ABNORMAL HIGH (ref 100–199)
HDL: 31 mg/dL — ABNORMAL LOW (ref >39)
Triglycerides: 1009 mg/dL (ref 0–149)

## 2023-08-20 NOTE — Progress Notes (Signed)
08/20/2023 Name: Michelle Hendricks MRN: [id number] DOB: [birth year]  Chief Complaint  Patient presents with   Medication Assistance    Lyrical Michelle Hendricks is a 57 y.o. year old female who presented for a telephone visit.   They were referred to the pharmacist by their PCP for assistance in managing diabetes, hyperlipidemia, and medication access.   Subjective:  Care Team: Primary Care Provider: Sherlyn Hay, DO   Medication Access/Adherence  Current Pharmacy:  [facility] [phone] Nicholes Rough, [place] - [address] [place] [address] Phone: [phone] Fax: [phone]  [facility] [address] [place] - [address] [place] [address] Phone: [phone] Fax: [phone]   Patient reports affordability concerns with their medications: Yes jardiance $30 for 30DS, onglyza $90 for 90DS  Diabetes:  Current medications: metformin XR 500mg  (taking 3 tablets instead of prescribed 4 tablets due to upset stomach), last filled jardiance in Feb for 90DS (out of it since may due to cost)   Will discuss glucose readings at future visit.    Hyperlipidemia/ASCVD Risk Reduction  Current lipid lowering medications: simvastatin 40mg  daily (recent increase by PCP from 20mg )    Objective:  Lab Results  Component Value Date   HGBA1C 8.8 (H) 08/14/2023    Lab Results  Component Value Date   CREATININE 0.57 08/14/2023   BUN 13 08/14/2023   NA 133 (L) 08/14/2023   K 4.8 08/14/2023   CL 94 (L) 08/14/2023   CO2 20 08/14/2023    Lab Results  Component Value Date   CHOL 206 (H) 08/19/2023   HDL 31 (L) 08/19/2023   LDLCALC Comment (A) 08/19/2023   TRIG 1,009 (HH) 08/19/2023   CHOLHDL 6.6 (H) 08/19/2023    Medications Reviewed Today   Medications were not reviewed in this encounter       Assessment/Plan:   Diabetes: - Currently uncontrolled, though out of medications due to cost - Recommend to obtain jardiance using copay  card. Copay card does not exist for onglyza, though may consider alternative medication in future.  - For now, focus on adjusting metformin dose to 2 tablets AM, 2 tablets PM for better GI tolerability and optimizing max daily dosage for glycemic control. Restart jardiance 25mg  daily.   Hyperlipidemia/ASCVD Risk Reduction: - Currently uncontrolled. PCP has increased statin dosage. In future, may consider high intensity statin.  - Recommend vascepa 1g BID, will collaborate with PCP. Providing copay card, will also likely need a prior auth.   Medication Access:  Jardiance Copay Card:  RxBIN: [id number] RxPCN: [facility] RxGRP: [id number] ISSUER: [phone]) ID: [id number]  Vascepa Copay Card:  Johnney Killian: [id number] PCN: CN GRP: [id number] ID: [id number]   Follow Up Plan: 1 month  Lynnda Shields, PharmD, BCPS Clinical Pharmacist [facility]

## 2023-08-20 NOTE — Patient Instructions (Addendum)
Elizabethanne,  Thank you for speaking with me today. As discussed, focus on adjusting your metformin dosage to 2 tablets in morning, 2 tablets in evening to get the most out of your dosing while hopefully minimizing stomach upset.  Pick up your jardiance using the copay card, information below:  Jardiance Copay Card:  RxBIN: [id number] RxPCN: [facility] RxGRP: [id number] ISSUER: ([id number]) ID: [id number]  I will work with Dr. Payton Mccallum about the medication for cholesterol/triglycerides, more to come on that once I hear back from her.  If you have questions or need to reach me sooner than our 1 month phone call, my number is [phone].  Take care, Elmarie Shiley, PharmD, BCPS Clinical Pharmacist [facility]

## 2023-09-11 MED ORDER — ICOSAPENT ETHYL 1 G PO CAPS
1.0000 g | ORAL_CAPSULE | Freq: Two times a day (BID) | ORAL | 3 refills | Status: DC
Start: 2023-09-11 — End: 2023-09-16

## 2023-09-11 NOTE — Addendum Note (Signed)
Addended by: Jacquenette Shone on: 09/11/2023 05:52 PM   Modules accepted: Orders

## 2023-09-12 ENCOUNTER — Other Ambulatory Visit: Payer: Self-pay | Admitting: Family Medicine

## 2023-09-12 DIAGNOSIS — E781 Pure hyperglyceridemia: Secondary | ICD-10-CM

## 2023-09-12 NOTE — Telephone Encounter (Signed)
Requested medication (s) are due for refill today: No  Requested medication (s) are on the active medication list: Yes  Last refill:  09/11/23  Future visit scheduled:   Notes to clinic:  Pharmacy asking for alternative, not covered by insurance.    Requested Prescriptions  Pending Prescriptions Disp Refills   omega-3 acid ethyl esters (LOVAZA) 1 g capsule [Pharmacy Med Name: OMEGA-3 ETHYL ESTERS 1 GM CAP] 1 capsule 0     There is no refill protocol information for this order

## 2023-09-19 ENCOUNTER — Other Ambulatory Visit: Payer: Self-pay | Admitting: Pharmacist

## 2023-09-19 ENCOUNTER — Other Ambulatory Visit: Payer: Self-pay | Admitting: Physician Assistant

## 2023-09-19 DIAGNOSIS — I10 Essential (primary) hypertension: Secondary | ICD-10-CM

## 2023-09-19 NOTE — Progress Notes (Signed)
09/19/2023 Name: Michelle Hendricks MRN: [id number] DOB: 1965/12/11  Chief Complaint  Patient presents with   Diabetes   Hyperlipidemia   Medication Assistance    Michelle Hendricks is a 57 y.o. year old female who presented for a telephone visit.   They were referred to the pharmacist by their PCP for assistance in managing diabetes, hyperlipidemia, and medication access.   Subjective:  Care Team: Primary Care Provider: Sherlyn Hay, DO   Medication Access/Adherence  Current Pharmacy:  [phone], [place] - [address] [place] [address] Phone: [phone] Fax: [phone]  [facility] [address] [place] - [address] [place] [address] Phone: [phone] Fax: [phone]   Patient reports affordability concerns with their medications: Yes jardiance $30 for 30DS, onglyza $90 for 90DS  Diabetes:  Current medications: metformin XR 500mg  (taking 3 tablets instead of prescribed 4 tablets due to upset stomach), last filled jardiance in Feb for 90DS (out of it since may due to cost)    Hyperlipidemia/ASCVD Risk Reduction  Current lipid lowering medications: simvastatin 40mg  daily (recent increase by PCP from 20mg )    Objective:  Lab Results  Component Value Date   HGBA1C 8.8 (H) 08/14/2023    Lab Results  Component Value Date   CREATININE 0.57 08/14/2023   BUN 13 08/14/2023   NA 133 (L) 08/14/2023   K 4.8 08/14/2023   CL 94 (L) 08/14/2023   CO2 20 08/14/2023    Lab Results  Component Value Date   CHOL 206 (H) 08/19/2023   HDL 31 (L) 08/19/2023   LDLCALC Comment (A) 08/19/2023   TRIG 1,009 (HH) 08/19/2023   CHOLHDL 6.6 (H) 08/19/2023    Medications Reviewed Today     Reviewed by Gabriel Carina, RPH (Pharmacist) on 09/19/23 at 1733  Med List Status: <None>   Medication Order Taking? Sig Documenting Provider Last Dose Status Informant  Blood Glucose Monitoring Suppl DEVI [id number]   1 each by Does not apply route in the morning, at noon, and at bedtime. May substitute to any manufacturer covered by patient's insurance. Sherlyn Hay, DO  Active   Choline Fenofibrate (FENOFIBRIC ACID) 135 MG CPDR [id number]  Take 1 capsule by mouth daily. Sherlyn Hay, DO  Active   citalopram (CELEXA) 40 MG tablet [id number]  TAKE 1 TABLET BY MOUTH EVERY DAY Thresa Ross, MD  Active   empagliflozin (JARDIANCE) 10 MG TABS tablet [id number]  TAKE 1 TABLET BY MOUTH EVERY DAY BEFORE BREAKFAST  Patient not taking: Reported on 08/14/2023   Alfredia Ferguson, PA-C  Active   Glucose Blood (BLOOD GLUCOSE TEST STRIPS) STRP [id number]  1 each by In Vitro route in the morning, at noon, and at bedtime. May substitute to any manufacturer covered by patient's insurance. Sherlyn Hay, DO  Active   lamoTRIgine (LAMICTAL) 25 MG tablet [id number]  TAKE 2 TABLETS BY MOUTH EVERY DAY Thresa Ross, MD  Active   Lancet Device MISC [id number]  1 each by Does not apply route in the morning, at noon, and at bedtime. May substitute to any manufacturer covered by patient's insurance. Sherlyn Hay, DO  Expired 08/14/23 2359   Lancets Misc. MISC [id number]  1 each by Does not apply route in the morning, at noon, and at bedtime. May substitute to any manufacturer covered by patient's insurance. Sherlyn Hay, DO  Expired 08/14/23 2359   levocetirizine (XYZAL) 5 MG tablet [id number]  Take  1 tablet (5 mg total) by mouth every evening. Sherlyn Hay, DO  Active   lisinopril (ZESTRIL) 20 MG tablet [id number]  Take 1 tablet (20 mg total) by mouth daily. Sherlyn Hay, DO  Active   metFORMIN (GLUCOPHAGE-XR) 500 MG 24 hr tablet [id number]  Take 4 tablets (2,000 mg total) by mouth daily with breakfast. Start with one tablet daily and increase by 1 tab every week until you reach 4 tablets daily without GI upset. Sherlyn Hay, DO  Active   saxagliptin HCl (ONGLYZA) 5 MG TABS tablet [id number]  Take 1 tablet (5 mg total) by mouth  daily.  Patient not taking: Reported on 08/14/2023   Alfredia Ferguson, PA-C  Active   simvastatin (ZOCOR) 40 MG tablet [id number] Yes Take 1 tablet (40 mg total) by mouth at bedtime. Jacky Kindle, FNP Taking Active               Assessment/Plan:   Diabetes: - Currently uncontrolled, though out of medications due to cost - Recommend to obtain jardiance using copay card. Copay card does not exist for onglyza, though may consider alternative medication in future.  - For now, focus on adjusting metformin dose to 2 tablets AM, 2 tablets PM for better GI tolerability and optimizing max daily dosage for glycemic control. Restart jardiance 25mg  daily.   Hyperlipidemia/ASCVD Risk Reduction: - Currently uncontrolled. PCP has increased statin dosage. In future, may consider high intensity statin.  - Recommend vascepa 1g BID, will collaborate with PCP. Providing copay card, will also likely need a prior auth.   Medication Access:  Jardiance Copay Card:  RxBIN: [id number] RxPCN: [facility] RxGRP: [id number] ISSUER: [phone]) ID: [id number]  Vascepa Copay Card:  Johnney Killian: [id number] PCN: CN GRP: [id number] ID: [id number]   Follow Up Plan: no follow up scheduled, will provide mychart message for patient to contact if needing future support  Lynnda Shields, PharmD, BCPS Clinical Pharmacist [facility]

## 2023-10-04 ENCOUNTER — Other Ambulatory Visit (HOSPITAL_COMMUNITY): Payer: Self-pay | Admitting: Psychiatry

## 2023-10-08 ENCOUNTER — Ambulatory Visit (HOSPITAL_COMMUNITY): Payer: 59 | Admitting: Psychiatry

## 2023-10-08 ENCOUNTER — Other Ambulatory Visit: Payer: Self-pay | Admitting: Family Medicine

## 2023-10-08 DIAGNOSIS — E781 Pure hyperglyceridemia: Secondary | ICD-10-CM

## 2023-10-10 ENCOUNTER — Telehealth (HOSPITAL_COMMUNITY): Payer: Self-pay | Admitting: *Deleted

## 2023-10-10 ENCOUNTER — Other Ambulatory Visit (HOSPITAL_COMMUNITY): Payer: Self-pay | Admitting: *Deleted

## 2023-10-10 ENCOUNTER — Ambulatory Visit (HOSPITAL_COMMUNITY): Payer: 59 | Admitting: Psychiatry

## 2023-10-10 MED ORDER — LAMOTRIGINE 25 MG PO TABS: 50.0000 mg | ORAL_TABLET | Freq: Every day | ORAL | 0 refills | Status: DC

## 2023-10-10 NOTE — Telephone Encounter (Signed)
PATIENT REQUESTED REFILL  lamoTRIgine (LAMICTAL) 25 MG tablet  HAD TO RESCHEDULE TODAY'S UNTIL 11/05/23

## 2023-10-10 NOTE — Telephone Encounter (Signed)
Refill Sent -- co sign

## 2023-11-05 ENCOUNTER — Ambulatory Visit (HOSPITAL_COMMUNITY): Payer: 59 | Admitting: Psychiatry

## 2023-11-05 ENCOUNTER — Encounter (HOSPITAL_COMMUNITY): Payer: Self-pay | Admitting: Psychiatry

## 2023-11-05 DIAGNOSIS — F419 Anxiety disorder, unspecified: Secondary | ICD-10-CM

## 2023-11-05 DIAGNOSIS — F32A Depression, unspecified: Secondary | ICD-10-CM | POA: Diagnosis not present

## 2023-11-05 MED ORDER — BUSPIRONE HCL 7.5 MG PO TABS: 7.5000 mg | ORAL_TABLET | Freq: Three times a day (TID) | ORAL | 0 refills | Status: DC

## 2023-11-05 MED ORDER — CITALOPRAM HYDROBROMIDE 40 MG PO TABS
ORAL_TABLET | ORAL | 0 refills | Status: DC
Start: 2023-11-05 — End: 2024-02-06
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
## 2023-11-08 ENCOUNTER — Ambulatory Visit: Payer: 59 | Admitting: Family Medicine

## 2023-11-08 ENCOUNTER — Encounter: Payer: Self-pay | Admitting: Family Medicine

## 2023-11-08 VITALS — BP 120/72 | HR 85 | Temp 99.0°F | Ht 61.0 in | Wt 160.0 lb

## 2023-11-08 DIAGNOSIS — E1165 Type 2 diabetes mellitus with hyperglycemia: Secondary | ICD-10-CM | POA: Diagnosis not present

## 2023-11-08 DIAGNOSIS — E1169 Type 2 diabetes mellitus with other specified complication: Secondary | ICD-10-CM

## 2023-11-08 DIAGNOSIS — F419 Anxiety disorder, unspecified: Secondary | ICD-10-CM

## 2023-11-08 DIAGNOSIS — I10 Essential (primary) hypertension: Secondary | ICD-10-CM

## 2023-11-08 DIAGNOSIS — E559 Vitamin D deficiency, unspecified: Secondary | ICD-10-CM | POA: Diagnosis not present

## 2023-11-08 DIAGNOSIS — E785 Hyperlipidemia, unspecified: Secondary | ICD-10-CM

## 2023-11-08 MED ORDER — SIMVASTATIN 80 MG PO TABS: 80.0000 mg | ORAL_TABLET | Freq: Every day | ORAL | 1 refills | Status: DC

## 2023-11-08 NOTE — Patient Instructions (Signed)
Check the dosage of your lisinopril and send a message to clarify.

## 2023-11-08 NOTE — Progress Notes (Signed)
Established patient visit   Patient: Michelle Hendricks   DOB: [birth year]   57 y.o. Female  MRN: [id number] Visit Date: 11/08/2023  Today's healthcare provider: Sherlyn Hay, DO   Chief Complaint  Patient presents with   Hypertension   Diabetes   Depression   Subjective    HPI Colonoscopy?  Cologuard previously ordered in 2020; no result available Pap smear due; history shows only wet preps to check for infection, not Pap smears   The patient, with a history of diabetes, hypertension, and hyperlipidemia, presents for a routine follow-up. She reports adherence to her current medication regimen, which includes Jardiance, Lisinopril, and Simvastatin. However, she has discontinued Metformin due to gastrointestinal side effects and has not been checking her blood sugars regularly.  The patient has recently had a tooth extraction, which has affected her dietary intake. She also reports intermittent itching, suggestive of a possible yeast infection, which she attributes to recent antibiotic use for dental work. She has been managing this with over-the-counter creams.  She has been prescribed Buspar for panic attacks, but expresses confusion about the dosing instructions and the need for the medication to be in her system before it will work. She reports taking the medication for two days but has not taken it on the day of the consultation.  The patient also mentions a recent change in her work situation with a new boss, which she finds stressful. She expresses a desire to visit her grandchildren but is unable to due to her husband's new job.     Medications: Outpatient Medications Prior to Visit  Medication Sig   Blood Glucose Monitoring Suppl DEVI 1 each by Does not apply route in the morning, at noon, and at bedtime. May substitute to any manufacturer covered by patient's insurance.   busPIRone (BUSPAR) 7.5 MG tablet Take 1 tablet (7.5 mg total) by mouth 3 (three) times daily.    citalopram (CELEXA) 40 MG tablet TAKE 1 TABLET BY MOUTH EVERY DAY   empagliflozin (JARDIANCE) 10 MG TABS tablet TAKE 1 TABLET BY MOUTH EVERY DAY BEFORE BREAKFAST   Glucose Blood (BLOOD GLUCOSE TEST STRIPS) STRP 1 each by In Vitro route in the morning, at noon, and at bedtime. May substitute to any manufacturer covered by patient's insurance.   lamoTRIgine (LAMICTAL) 25 MG tablet Take 2 tablets (50 mg total) by mouth daily.   lisinopril (ZESTRIL) 20 MG tablet Take 1 tablet (20 mg total) by mouth daily.   [DISCONTINUED] simvastatin (ZOCOR) 40 MG tablet Take 1 tablet (40 mg total) by mouth at bedtime.   Choline Fenofibrate (FENOFIBRIC ACID) 135 MG CPDR Take 1 capsule by mouth daily. (Patient not taking: Reported on 11/08/2023)   Lancet Device MISC 1 each by Does not apply route in the morning, at noon, and at bedtime. May substitute to any manufacturer covered by patient's insurance.   Lancets Misc. MISC 1 each by Does not apply route in the morning, at noon, and at bedtime. May substitute to any manufacturer covered by patient's insurance.   levocetirizine (XYZAL) 5 MG tablet Take 1 tablet (5 mg total) by mouth every evening. (Patient not taking: Reported on 11/08/2023)   metFORMIN (GLUCOPHAGE-XR) 500 MG 24 hr tablet Take 4 tablets (2,000 mg total) by mouth daily with breakfast. Start with one tablet daily and increase by 1 tab every week until you reach 4 tablets daily without GI upset. (Patient not taking: Reported on 11/08/2023)   [DISCONTINUED] saxagliptin HCl (ONGLYZA) 5  MG TABS tablet Take 1 tablet (5 mg total) by mouth daily. (Patient not taking: Reported on 11/08/2023)   No facility-administered medications prior to visit.        Objective    BP 120/72 (BP Location: Left Arm, Patient Position: Sitting, Cuff Size: Normal)   Pulse 85   Temp 99 F (37.2 C) (Oral)   Ht 5\' 1"  (1.549 m)   Wt 160 lb (72.6 kg)   LMP 04/16/2015   SpO2 99%   BMI 30.23 kg/m     Physical Exam Constitutional:       Appearance: Normal appearance.  HENT:     Head: Normocephalic and atraumatic.  Eyes:     General: No scleral icterus.    Conjunctiva/sclera: Conjunctivae normal.  Cardiovascular:     Pulses:          Dorsalis pedis pulses are 2+ on the right side and 2+ on the left side.       Posterior tibial pulses are 2+ on the right side and 2+ on the left side.  Musculoskeletal:     Right foot: Normal range of motion. No deformity, bunion, Charcot foot, foot drop or prominent metatarsal heads.     Left foot: Normal range of motion. No deformity, bunion, Charcot foot, foot drop or prominent metatarsal heads.  Feet:     Right foot:     Protective Sensation: 10 sites tested.  10 sites sensed.     Skin integrity: No ulcer, blister, skin breakdown, erythema, warmth, callus, dry skin or fissure.     Toenail Condition: Right toenails are normal.     Left foot:     Protective Sensation: 10 sites tested.  10 sites sensed.     Skin integrity: No ulcer, blister, skin breakdown, erythema, warmth, callus, dry skin or fissure.     Toenail Condition: Left toenails are normal.  Neurological:     Mental Status: She is alert and oriented to person, place, and time. Mental status is at baseline.  Psychiatric:        Mood and Affect: Mood normal.        Behavior: Behavior normal.      Results for orders placed or performed in visit on 11/08/23  Comprehensive metabolic panel  Result Value Ref Range   Glucose 181 (H) 70 - 99 mg/dL   BUN 13 6 - 24 mg/dL   Creatinine, Ser 1.61 0.57 - 1.00 mg/dL   eGFR 99 >09 UE/AVW/0.98   BUN/Creatinine Ratio 18 9 - 23   Sodium 137 134 - 144 mmol/L   Potassium 3.8 3.5 - 5.2 mmol/L   Chloride 99 96 - 106 mmol/L   CO2 22 20 - 29 mmol/L   Calcium 9.4 8.7 - 10.2 mg/dL   Total Protein 6.7 6.0 - 8.5 g/dL   Albumin 4.2 3.8 - 4.9 g/dL   Globulin, Total 2.5 1.5 - 4.5 g/dL   Bilirubin Total <1.1 0.0 - 1.2 mg/dL   Alkaline Phosphatase 85 44 - 121 IU/L   AST 15 0 - 40 IU/L    ALT 14 0 - 32 IU/L  Lipid panel  Result Value Ref Range   Cholesterol, Total 211 (H) 100 - 199 mg/dL   Triglycerides 9,147 (HH) 0 - 149 mg/dL   HDL 30 (L) >82 mg/dL   VLDL Cholesterol Cal Comment (A) 5 - 40 mg/dL   LDL Chol Calc (NIH) Comment (A) 0 - 99 mg/dL   LDL CALC COMMENT: Comment  Chol/HDL Ratio 7.0 (H) 0.0 - 4.4 ratio  Hemoglobin A1c  Result Value Ref Range   Hgb A1c MFr Bld 10.2 (H) 4.8 - 5.6 %   Est. average glucose Bld gHb Est-mCnc 246 mg/dL  VITAMIN D 25 Hydroxy (Vit-D Deficiency, Fractures)  Result Value Ref Range   Vit D, 25-Hydroxy 10.0 (L) 30.0 - 100.0 ng/mL    Assessment & Plan    Type 2 diabetes mellitus with hyperglycemia, without long-term current use of insulin (HCC) Assessment & Plan: Currently taking Jardiance daily but not metformin due to gastrointestinal side effects. Not checking blood sugars regularly. Discussed the importance of regular blood sugar monitoring and managing gastrointestinal side effects of metformin. Explained that metformin and Jardiance can be taken together and discussed the benefits of metformin in controlling blood sugar levels.   - Continue Jardiance daily   - Discuss strategies to manage gastrointestinal side effects of metformin   - Encourage regular blood sugar monitoring    Orders: -     Lipid panel -     Hemoglobin A1c  Primary hypertension Assessment & Plan: Currently taking lisinopril. Needs to verify the dosage as there was some confusion about whether she is taking 10 mg or 20 mg. Discussed the importance of accurate dosing for blood pressure control.   - Verify lisinopril dosage   - Continue lisinopril as prescribed    Orders: -     Comprehensive metabolic panel  Hyperlipidemia associated with type 2 diabetes mellitus (HCC) Assessment & Plan: Taking simvastatin, currently at a dose of 40 mg. Not taking fenofibrate. Discussed the benefits of simvastatin in lowering cholesterol and the need for regular monitoring.    - Send prescription for increased dose of simvastatin   - Discontinue fenofibrate    Orders: -     Simvastatin; Take 1 tablet (80 mg total) by mouth daily.  Dispense: 90 tablet; Refill: 1  Vitamin D deficiency -     VITAMIN D 25 Hydroxy (Vit-D Deficiency, Fractures)  Anxiety Assessment & Plan: Recently prescribed Buspar for panic attacks but is confused about the dosing regimen. Has taken it for two days but not today. Discussed the importance of taking Buspar as prescribed to manage panic attacks effectively.   - Clarify dosing regimen for Buspar with prescribing provider   - Continue Buspar as prescribed     Post-Menopausal Symptoms   Reports intermittent itching and believes she is nearing post-menopause. Has used cream for relief. Discussed the potential for hormonal changes to cause symptoms and the importance of monitoring.   - Monitor symptoms   - Consider further evaluation if symptoms persist    General Health Maintenance   Due for several screenings and vaccinations but prefers to delay them until next year. Has not had a recent Pap smear, mammogram, or colonoscopy. Declined the tetanus and pneumonia vaccines. Discussed the importance of these screenings and vaccinations for preventive health.   - Schedule Pap smear in three months   - Discuss mammogram and colonoscopy screenings at next visit   - Revisit vaccination status next year   - Order blood work including A1c, metabolic panel, and vitamin D   - Request eye exam records.   Return in about 3 months (around 02/06/2024) for Pap, DM.      I discussed the assessment and treatment plan with the patient  The patient was provided an opportunity to ask questions and all were answered. The patient agreed with the plan and demonstrated an understanding of the  instructions.   The patient was advised to call back or seek an in-person evaluation if the symptoms worsen or if the condition fails to improve as  anticipated.    Sherlyn Hay, DO  [facility] [phone] (phone) [phone] (fax)  [facility]

## 2023-11-12 LAB — COMPREHENSIVE METABOLIC PANEL
ALT: 14 [IU]/L (ref 0–32)
AST: 15 [IU]/L (ref 0–40)
Albumin: 4.2 g/dL (ref 3.8–4.9)
Alkaline Phosphatase: 85 [IU]/L (ref 44–121)
BUN/Creatinine Ratio: 18 (ref 9–23)
BUN: 13 mg/dL (ref 6–24)
Bilirubin Total: <0.2 mg/dL (ref 0.0–1.2)
CO2: 22 mmol/L (ref 20–29)
Calcium: 9.4 mg/dL (ref 8.7–10.2)
Chloride: 99 mmol/L (ref 96–106)
Creatinine, Ser: 0.72 mg/dL (ref 0.57–1.00)
Globulin, Total: 2.5 g/dL (ref 1.5–4.5)
Glucose: 181 mg/dL — ABNORMAL HIGH (ref 70–99)
Potassium: 3.8 mmol/L (ref 3.5–5.2)
Sodium: 137 mmol/L (ref 134–144)
Total Protein: 6.7 g/dL (ref 6.0–8.5)
eGFR: 99 mL/min/{1.73_m2} (ref >59)

## 2023-11-12 LAB — LIPID PANEL
Chol/HDL Ratio: 7 {ratio} — ABNORMAL HIGH (ref 0.0–4.4)
Cholesterol, Total: 211 mg/dL — ABNORMAL HIGH (ref 100–199)
HDL: 30 mg/dL — ABNORMAL LOW (ref >39)
Triglycerides: 1037 mg/dL (ref 0–149)

## 2023-11-12 LAB — VITAMIN D 25 HYDROXY (VIT D DEFICIENCY, FRACTURES): Vit D, 25-Hydroxy: 10 ng/mL — ABNORMAL LOW (ref 30.0–100.0)

## 2023-11-12 LAB — HEMOGLOBIN A1C
Est. average glucose Bld gHb Est-mCnc: 246 mg/dL
Hgb A1c MFr Bld: 10.2 % — ABNORMAL HIGH (ref 4.8–5.6)

## 2023-11-13 ENCOUNTER — Ambulatory Visit: Payer: 59 | Admitting: Family Medicine

## 2023-11-26 MED ORDER — VITAMIN D (ERGOCALCIFEROL) 1.25 MG (50000 UNIT) PO CAPS: 50000.0000 [IU] | ORAL_CAPSULE | ORAL | 1 refills | Status: DC

## 2023-11-26 NOTE — Assessment & Plan Note (Signed)
Recently prescribed Buspar for panic attacks but is confused about the dosing regimen. Has taken it for two days but not today. Discussed the importance of taking Buspar as prescribed to manage panic attacks effectively.   - Clarify dosing regimen for Buspar with prescribing provider   - Continue Buspar as prescribed

## 2023-11-26 NOTE — Assessment & Plan Note (Signed)
Currently taking lisinopril. Needs to verify the dosage as there was some confusion about whether she is taking 10 mg or 20 mg. Discussed the importance of accurate dosing for blood pressure control.   - Verify lisinopril dosage   - Continue lisinopril as prescribed

## 2023-11-26 NOTE — Assessment & Plan Note (Addendum)
Taking simvastatin, currently at a dose of 40 mg. Not taking fenofibrate. Discussed the benefits of simvastatin in lowering cholesterol and the need for regular monitoring.   - Send prescription for increased dose of simvastatin   - Discontinue fenofibrate

## 2023-11-26 NOTE — Assessment & Plan Note (Signed)
Currently taking Jardiance daily but not metformin due to gastrointestinal side effects. Not checking blood sugars regularly. Discussed the importance of regular blood sugar monitoring and managing gastrointestinal side effects of metformin. Explained that metformin and Jardiance can be taken together and discussed the benefits of metformin in controlling blood sugar levels.   - Continue Jardiance daily   - Discuss strategies to manage gastrointestinal side effects of metformin   - Encourage regular blood sugar monitoring

## 2023-12-05 ENCOUNTER — Emergency Department
Admission: EM | Admit: 2023-12-05 | Discharge: 2023-12-06 | Disposition: A | Discharge status: Home / Self Care | Payer: 59 | Attending: Emergency Medicine | Admitting: Emergency Medicine

## 2023-12-05 ENCOUNTER — Emergency Department: Payer: 59

## 2023-12-05 ENCOUNTER — Encounter: Payer: Self-pay | Admitting: Emergency Medicine

## 2023-12-05 ENCOUNTER — Other Ambulatory Visit: Payer: Self-pay

## 2023-12-05 DIAGNOSIS — R1030 Lower abdominal pain, unspecified: Secondary | ICD-10-CM | POA: Insufficient documentation

## 2023-12-05 DIAGNOSIS — R112 Nausea with vomiting, unspecified: Secondary | ICD-10-CM | POA: Insufficient documentation

## 2023-12-05 DIAGNOSIS — Z20822 Contact with and (suspected) exposure to covid-19: Secondary | ICD-10-CM | POA: Diagnosis not present

## 2023-12-05 DIAGNOSIS — E860 Dehydration: Secondary | ICD-10-CM | POA: Insufficient documentation

## 2023-12-05 DIAGNOSIS — I10 Essential (primary) hypertension: Secondary | ICD-10-CM | POA: Diagnosis not present

## 2023-12-05 DIAGNOSIS — R748 Abnormal levels of other serum enzymes: Secondary | ICD-10-CM | POA: Diagnosis not present

## 2023-12-05 DIAGNOSIS — E119 Type 2 diabetes mellitus without complications: Secondary | ICD-10-CM | POA: Diagnosis not present

## 2023-12-05 DIAGNOSIS — E878 Other disorders of electrolyte and fluid balance, not elsewhere classified: Secondary | ICD-10-CM | POA: Insufficient documentation

## 2023-12-05 DIAGNOSIS — R21 Rash and other nonspecific skin eruption: Secondary | ICD-10-CM | POA: Insufficient documentation

## 2023-12-05 DIAGNOSIS — N83201 Unspecified ovarian cyst, right side: Secondary | ICD-10-CM

## 2023-12-05 DIAGNOSIS — R1110 Vomiting, unspecified: Secondary | ICD-10-CM | POA: Diagnosis present

## 2023-12-05 LAB — COMPREHENSIVE METABOLIC PANEL
ALT: 19 U/L (ref 0–44)
AST: 24 U/L (ref 15–41)
Albumin: 4.7 g/dL (ref 3.5–5.0)
Alkaline Phosphatase: 57 U/L (ref 38–126)
Anion gap: 20 — ABNORMAL HIGH (ref 5–15)
BUN: 20 mg/dL (ref 6–20)
CO2: 22 mmol/L (ref 22–32)
Calcium: 10.1 mg/dL (ref 8.9–10.3)
Chloride: 92 mmol/L — ABNORMAL LOW (ref 98–111)
Creatinine, Ser: 0.71 mg/dL (ref 0.44–1.00)
GFR, Estimated: >60 mL/min (ref >60)
Glucose, Bld: 162 mg/dL — ABNORMAL HIGH (ref 70–99)
Potassium: 3.8 mmol/L (ref 3.5–5.1)
Sodium: 134 mmol/L — ABNORMAL LOW (ref 135–145)
Total Bilirubin: 1 mg/dL (ref 0.0–1.2)
Total Protein: 8.1 g/dL (ref 6.5–8.1)

## 2023-12-05 LAB — URINALYSIS, ROUTINE W REFLEX MICROSCOPIC
Bacteria, UA: NONE SEEN
Bilirubin Urine: NEGATIVE
Glucose, UA: >=500 mg/dL — AB
Ketones, ur: 80 mg/dL — AB
Leukocytes,Ua: NEGATIVE
Nitrite: NEGATIVE
Protein, ur: 100 mg/dL — AB
Specific Gravity, Urine: >1.046 — ABNORMAL HIGH (ref 1.005–1.030)
pH: 6 (ref 5.0–8.0)

## 2023-12-05 LAB — CBC WITH DIFFERENTIAL/PLATELET
Abs Immature Granulocytes: 0.14 10*3/uL — ABNORMAL HIGH (ref 0.00–0.07)
Basophils Absolute: 0 10*3/uL (ref 0.0–0.1)
Basophils Relative: 0 %
Eosinophils Absolute: 0 10*3/uL (ref 0.0–0.5)
Eosinophils Relative: 0 %
HCT: 44.9 % (ref 36.0–46.0)
Hemoglobin: 15.8 g/dL — ABNORMAL HIGH (ref 12.0–15.0)
Immature Granulocytes: 1 %
Lymphocytes Relative: 8 %
Lymphs Abs: 1.7 10*3/uL (ref 0.7–4.0)
MCH: 31.1 pg (ref 26.0–34.0)
MCHC: 35.2 g/dL (ref 30.0–36.0)
MCV: 88.4 fL (ref 80.0–100.0)
Monocytes Absolute: 1.2 10*3/uL — ABNORMAL HIGH (ref 0.1–1.0)
Monocytes Relative: 6 %
Neutro Abs: 19 10*3/uL — ABNORMAL HIGH (ref 1.7–7.7)
Neutrophils Relative %: 85 %
Platelets: 307 10*3/uL (ref 150–400)
RBC: 5.08 MIL/uL (ref 3.87–5.11)
RDW: 11.9 % (ref 11.5–15.5)
WBC: 22 10*3/uL — ABNORMAL HIGH (ref 4.0–10.5)
nRBC: 0 % (ref 0.0–0.2)

## 2023-12-05 LAB — LIPASE, BLOOD: Lipase: 58 U/L — ABNORMAL HIGH (ref 11–51)

## 2023-12-05 LAB — RESP PANEL BY RT-PCR (RSV, FLU A&B, COVID)  RVPGX2
Influenza A by PCR: NEGATIVE
Influenza B by PCR: NEGATIVE
Resp Syncytial Virus by PCR: NEGATIVE
SARS Coronavirus 2 by RT PCR: NEGATIVE

## 2023-12-05 LAB — TROPONIN I (HIGH SENSITIVITY): Troponin I (High Sensitivity): 10 ng/L (ref <18)

## 2023-12-05 MED ORDER — IOHEXOL 300 MG/ML  SOLN
100.0000 mL | Freq: Once | INTRAMUSCULAR | Status: AC | PRN
  Administered 2023-12-05: 100 mL via INTRAVENOUS

## 2023-12-05 MED ORDER — ONDANSETRON HCL 4 MG/2ML IJ SOLN
4.0000 mg | Freq: Once | INTRAMUSCULAR | Status: AC
  Administered 2023-12-05: 4 mg via INTRAVENOUS
  Filled 2023-12-05: qty 2

## 2023-12-05 MED ORDER — ONDANSETRON 4 MG PO TBDP: 4.0000 mg | ORAL_TABLET | Freq: Three times a day (TID) | ORAL | 0 refills | Status: DC | PRN

## 2023-12-05 MED ORDER — DROPERIDOL 2.5 MG/ML IJ SOLN
2.5000 mg | Freq: Once | INTRAMUSCULAR | Status: AC
  Administered 2023-12-05: 2.5 mg via INTRAVENOUS
  Filled 2023-12-05: qty 2

## 2023-12-05 MED ORDER — SODIUM CHLORIDE 0.9 % IV BOLUS
1000.0000 mL | Freq: Once | INTRAVENOUS | Status: AC
Start: 2023-12-05 — End: 2023-12-06
  Administered 2023-12-05: 1000 mL via INTRAVENOUS

## 2023-12-05 MED ORDER — SODIUM CHLORIDE 0.9 % IV BOLUS
1000.0000 mL | Freq: Once | INTRAVENOUS | Status: AC
  Administered 2023-12-05: 1000 mL via INTRAVENOUS

## 2023-12-05 NOTE — Discharge Instructions (Addendum)
 Your blood work showed an elevated white cell count and evidence of dehydration. I believe the white cells are in response to viral gastroenteritis.  Your urinalysis did not show signs of infection but further supported evidence of dehydration.  Your respiratory panel was negative for flu, COVID and RSV.  CT scan of your abdomen and pelvis showed a stable right ovarian cyst and was otherwise normal.  They recommend that you follow-up for pelvic ultrasound in 6 to 12 months.  This can be coordinated by your primary care provider or your OB/GYN.  I have sent nausea medication to the pharmacy.  This can be taken every 8 hours as needed.  You may use over-the-counter Imodium for diarrhea.   You may alternate Tylenol  1000 mg every 6 hours as needed for pain, fever and Ibuprofen 800 mg every 6-8 hours as needed for pain, fever.  Please take Ibuprofen with food.  Do not take more than 4000 mg of Tylenol  (acetaminophen ) in a 24 hour period.

## 2023-12-05 NOTE — ED Provider Triage Note (Signed)
 Emergency Medicine Provider Triage Evaluation Note  Michelle Hendricks , a 58 y.o. female  was evaluated in triage.  Pt complains of vomiting since yesterday morning. She is not sure if it is food poisoning. Reports eating a chicken sandwich. No other sick contacts.   Review of Systems  Positive: Vomiting, lower abdominal pain Negative: Fever, diarrhea,   Physical Exam  LMP 04/16/2015  Gen:   Awake, no distress   Resp:  Normal effort  MSK:   Moves extremities without difficulty  Other:    Medical Decision Making  Medically screening exam initiated at 5:01 PM.  Appropriate orders placed.  Michelle Hendricks was informed that the remainder of the evaluation will be completed by another provider, this initial triage assessment does not replace that evaluation, and the importance of remaining in the ED until their evaluation is complete.     Michelle Tinnie LABOR, PA-C 12/05/23 1703

## 2023-12-05 NOTE — ED Provider Notes (Signed)
 11:50 PM  Assumed care at shift change.  Patient here witn N/V/D, dehydration.  Getting meds and will need reassessment.  CTAP negative for acute abnormality.  12:35 AM   Pt reports feeling much better after additional medications.  Tolerating p.o.  Will discharge home.  Antiemetics have been sent to her pharmacy.  Recommended bland diet for several days.  Suspect viral gastroenteritis causing symptoms today.   At this time, I do not feel there is any life-threatening condition present. I reviewed all nursing notes, vitals, pertinent previous records.  All lab and urine results, EKGs, imaging ordered have been independently reviewed and interpreted by myself.  I reviewed all available radiology reports from any imaging ordered this visit.  Based on my assessment, I feel the patient is safe to be discharged home without further emergent workup and can continue workup as an outpatient as needed. Discussed all findings, treatment plan as well as usual and customary return precautions.  They verbalize understanding and are comfortable with this plan.  Outpatient follow-up has been provided as needed.  All questions have been answered.    Krystina Strieter, Josette SAILOR, DO 12/06/23 [phone]

## 2023-12-05 NOTE — ED Provider Notes (Signed)
 [facility] Provider Note    Event Date/Time   First MD Initiated Contact with Patient 12/05/23 1937     (approximate)   History   Emesis   HPI  Michelle Hendricks is a 58 y.o. female with PMH of anxiety, hypertension, depression and diabetes presents for evaluation of emesis.  Patient states she began throwing up yesterday after eating a chicken sandwich.  Since then she has had several episodes of vomiting and reports she has been unable to keep anything down.  She denies fevers and diarrhea but states she does have some lower abdominal pain.  Patient reports that she has been taking Mounjaro  for her diabetes and had her last dose 2 days ago.      Physical Exam   Triage Vital Signs: ED Triage Vitals  Encounter Vitals Group     BP 12/05/23 1702 (!) 154/80     Systolic BP Percentile --      Diastolic BP Percentile --      Pulse Rate 12/05/23 1702 89     Resp 12/05/23 1702 18     Temp 12/05/23 1702 99.3 F (37.4 C)     Temp Source 12/05/23 1702 Oral     SpO2 12/05/23 1702 99 %     Weight 12/05/23 1703 155 lb (70.3 kg)     Height 12/05/23 1703 5' 2 (1.575 m)     Head Circumference --      Peak Flow --      Pain Score 12/05/23 1703 8     Pain Loc --      Pain Education --      Exclude from Growth Chart --     Most recent vital signs: Vitals:   12/05/23 2116 12/05/23 2336  BP: (!) 143/83 (!) 155/80  Pulse: 100 89  Resp: 18 19  Temp: 98.4 F (36.9 C) 98.9 F (37.2 C)  SpO2: 100% 100%    General: Awake, no distress.  CV:  Good peripheral perfusion. RRR, Resp:  Normal effort. CTAB. Abd:  No distention. Soft, TTP across the lower abdomen, bowel sounds appropriate, petechial rash across abdomen Other:     ED Results / Procedures / Treatments   Labs (all labs ordered are listed, but only abnormal results are displayed) Labs Reviewed  COMPREHENSIVE METABOLIC PANEL - Abnormal; Notable for the following components:      Result Value   Sodium  134 (*)    Chloride 92 (*)    Glucose, Bld 162 (*)    Anion gap 20 (*)    All other components within normal limits  LIPASE, BLOOD - Abnormal; Notable for the following components:   Lipase 58 (*)    All other components within normal limits  CBC WITH DIFFERENTIAL/PLATELET - Abnormal; Notable for the following components:   WBC 22.0 (*)    Hemoglobin 15.8 (*)    Neutro Abs 19.0 (*)    Monocytes Absolute 1.2 (*)    Abs Immature Granulocytes 0.14 (*)    All other components within normal limits  URINALYSIS, ROUTINE W REFLEX MICROSCOPIC - Abnormal; Notable for the following components:   Color, Urine YELLOW (*)    APPearance CLEAR (*)    Specific Gravity, Urine >1.046 (*)    Glucose, UA >=500 (*)    Hgb urine dipstick SMALL (*)    Ketones, ur 80 (*)    Protein, ur 100 (*)    All other components within normal limits  RESP PANEL  BY RT-PCR (RSV, FLU A&B, COVID)  RVPGX2  TROPONIN I (HIGH SENSITIVITY)    RADIOLOGY  CT abdomen pelvis obtained, interpreted the images as well as reviewed the radiologist report which did not show any acute abnormalities or explanation for her abdominal pain.  She does have a cystic lesion in the right adnexa that is stable.  Mild hepatic steatosis and a small hiatal hernia.   PROCEDURES:  Critical Care performed: No  Procedures   MEDICATIONS ORDERED IN ED: Medications  sodium chloride  0.9 % bolus 1,000 mL (1,000 mLs Intravenous Bolus from Bag 12/05/23 2045)  ondansetron  (ZOFRAN ) injection 4 mg (4 mg Intravenous Given 12/05/23 2045)  iohexol  (OMNIPAQUE ) 300 MG/ML solution 100 mL (100 mLs Intravenous Contrast Given 12/05/23 2103)  droperidol  (INAPSINE ) 2.5 MG/ML injection 2.5 mg (2.5 mg Intravenous Given 12/05/23 2338)  sodium chloride  0.9 % bolus 1,000 mL (1,000 mLs Intravenous New Bag/Given 12/05/23 2337)     IMPRESSION / MDM / ASSESSMENT AND PLAN / ED COURSE  I reviewed the triage vital signs and the nursing notes.                              58 year old female presents for evaluation of vomiting since yesterday.  Patient was hypertensive in triage, appears uncomfortable and has been vomiting on and off since waiting room.  Differential diagnosis includes, but is not limited to, bowel obstruction, gastroenteritis, pancreatitis, cholecystitis, appendicitis, gastroparesis.  Patient's presentation is most consistent with acute complicated illness / injury requiring diagnostic workup.  CBC notable leukocytosis with neutrophilia, elevated hemoglobin.  CMP shows low chloride and an anion gap of 20.  Lipase is elevated at 58.  Troponin not elevated.  Urinalysis notable for elevated specific gravity, presence of glucose, hemoglobin, ketones and protein.  Consistent with dehydration.  Respiratory panel negative.  CT abdomen pelvis showed a stable adnexal cyst but was otherwise negative for any acute abnormalities, no explanation for her abdominal pain.  Patient is given IV fluids to address her dehydration.  I also gave her Zofran  to help with the nausea.  We will p.o. challenge, if able to tolerate p.o. she can be discharged with nausea medication.  Patient had another episode of vomiting, will give patient droperidol  and monitor. If patient can tolerate PO after this she can be discharged with nausea medication. Care of the patient will be passed off to Dr. Neomi.     FINAL CLINICAL IMPRESSION(S) / ED DIAGNOSES   Final diagnoses:  Nausea and vomiting, unspecified vomiting type     Rx / DC Orders   ED Discharge Orders          Ordered    ondansetron  (ZOFRAN -ODT) 4 MG disintegrating tablet  Every 8 hours PRN        12/05/23 2342             Note:  This document was prepared using Dragon voice recognition software and may include unintentional dictation errors.   [name], PA-C 12/05/23 2351    Suzanne Kirsch, MD 12/06/23 [phone]

## 2023-12-05 NOTE — ED Provider Notes (Signed)
 Shared visit  Presents to the emergency department with multiple episodes of vomiting.  Unable to keep down anything since yesterday.  CT scan without acute intra-abdominal pathology.  No lower abdominal pain soft low suspicion for abscess.  Noted a cystic lesion in the right adnexa and recommended repeat pelvic ultrasound in 6 to 12 months.  Small hiatal hernia.  UA concerning for significant findings of dehydration.  Given IV fluids and antiemetics, will do a p.o. challenge.  Possible viral illness, gastritis/PUD or gastroparesis.   Suzanne Kirsch, MD 12/05/23 [phone]

## 2023-12-05 NOTE — ED Triage Notes (Signed)
 Patient to ED via POV for vomiting. Started yesterday after eating a chicken sandwich. Unable to keep anything down.

## 2023-12-10 ENCOUNTER — Ambulatory Visit: Payer: 59 | Admitting: Family Medicine

## 2023-12-10 ENCOUNTER — Encounter: Payer: Self-pay | Admitting: Family Medicine

## 2023-12-10 VITALS — BP 106/62 | HR 85 | Resp 16 | Ht 62.0 in | Wt 145.0 lb

## 2023-12-10 DIAGNOSIS — E1165 Type 2 diabetes mellitus with hyperglycemia: Secondary | ICD-10-CM

## 2023-12-10 DIAGNOSIS — Z720 Tobacco use: Secondary | ICD-10-CM

## 2023-12-10 DIAGNOSIS — E559 Vitamin D deficiency, unspecified: Secondary | ICD-10-CM

## 2023-12-10 DIAGNOSIS — K2289 Other specified disease of esophagus: Secondary | ICD-10-CM | POA: Diagnosis not present

## 2023-12-10 DIAGNOSIS — F321 Major depressive disorder, single episode, moderate: Secondary | ICD-10-CM

## 2023-12-10 DIAGNOSIS — F331 Major depressive disorder, recurrent, moderate: Secondary | ICD-10-CM

## 2023-12-10 DIAGNOSIS — Z7984 Long term (current) use of oral hypoglycemic drugs: Secondary | ICD-10-CM

## 2023-12-10 DIAGNOSIS — E1169 Type 2 diabetes mellitus with other specified complication: Secondary | ICD-10-CM

## 2023-12-10 MED ORDER — PANTOPRAZOLE SODIUM 40 MG PO TBEC: 40.0000 mg | DELAYED_RELEASE_TABLET | Freq: Every day | ORAL | 0 refills | Status: DC

## 2023-12-10 MED ORDER — TIRZEPATIDE 5 MG/0.5ML ~~LOC~~ SOAJ: 5.0000 mg | SUBCUTANEOUS | 0 refills | Status: DC

## 2023-12-10 NOTE — Progress Notes (Signed)
 Established patient visit   Patient: Michelle Hendricks   DOB: [birth year]   58 y.o. Female  MRN: [id number] Visit Date: 12/10/2023  Today's healthcare provider: LAURAINE LOISE BUOY, DO   Chief Complaint  Patient presents with   Medical Management of Chronic Issues    Weight-On Mounjaro    Subjective    HPI The patient, with a history of diabetes, presented with a chief complaint of persistent abdominal discomfort, described as a sensation of trapped air that is not relieved by burping. The discomfort was likened to the pain experienced when swallowing air, and was noted to be present in general, not specifically related to breathing. The patient reported feeling the discomfort all the way down the abdomen, likening it to a sensation of air moving within.  The patient had been on a trial of Mounjaro  for diabetes management, which was well-tolerated for the first three weeks. However, the patient then contracted a stomach virus, leading to severe vomiting but no diarrhea. The patient reported losing 15 pounds during this period, although it was acknowledged that this was likely due to the illness rather than the medication. The patient has since been able to keep food down and has continued her medication regimen.  The patient also reported a recent episode of vitamin D  deficiency, for which she was prescribed a high-dose supplement. The patient has been compliant with this regimen.  The patient is a current smoker, although she reported a recent reduction in smoking frequency. The patient has a history of refusing certain preventative care measures, including mammograms and the shingles vaccine.  The patient's abdominal discomfort has been ongoing since the episode of vomiting and has not improved with the use of Tums or B12 supplements. The patient has not previously been diagnosed with a hernia or any other gastrointestinal condition.     Medications: Outpatient Medications Prior to Visit   Medication Sig   Blood Glucose Monitoring Suppl DEVI 1 each by Does not apply route in the morning, at noon, and at bedtime. May substitute to any manufacturer covered by patient's insurance.   busPIRone  (BUSPAR ) 7.5 MG tablet Take 1 tablet (7.5 mg total) by mouth 3 (three) times daily.   Choline Fenofibrate (FENOFIBRIC ACID) 135 MG CPDR Take 1 capsule by mouth daily.   citalopram  (CELEXA ) 40 MG tablet TAKE 1 TABLET BY MOUTH EVERY DAY   empagliflozin  (JARDIANCE ) 10 MG TABS tablet TAKE 1 TABLET BY MOUTH EVERY DAY BEFORE BREAKFAST   Glucose Blood (BLOOD GLUCOSE TEST STRIPS) STRP 1 each by In Vitro route in the morning, at noon, and at bedtime. May substitute to any manufacturer covered by patient's insurance.   lamoTRIgine  (LAMICTAL ) 25 MG tablet Take 2 tablets (50 mg total) by mouth daily.   levocetirizine (XYZAL ) 5 MG tablet Take 1 tablet (5 mg total) by mouth every evening.   lisinopril  (ZESTRIL ) 20 MG tablet Take 1 tablet (20 mg total) by mouth daily.   ondansetron  (ZOFRAN -ODT) 4 MG disintegrating tablet Take 1 tablet (4 mg total) by mouth every 8 (eight) hours as needed for nausea or vomiting.   simvastatin  (ZOCOR ) 80 MG tablet Take 1 tablet (80 mg total) by mouth daily.   Vitamin D , Ergocalciferol , (DRISDOL ) 1.25 MG (50000 UNIT) CAPS capsule Take 1 capsule (50,000 Units total) by mouth every 7 (seven) days.   Lancet Device MISC 1 each by Does not apply route in the morning, at noon, and at bedtime. May substitute to any manufacturer covered by patient's  insurance.   Lancets Misc. MISC 1 each by Does not apply route in the morning, at noon, and at bedtime. May substitute to any manufacturer covered by patient's insurance.   [DISCONTINUED] metFORMIN  (GLUCOPHAGE -XR) 500 MG 24 hr tablet Take 4 tablets (2,000 mg total) by mouth daily with breakfast. Start with one tablet daily and increase by 1 tab every week until you reach 4 tablets daily without GI upset.   No facility-administered medications  prior to visit.    Review of Systems  Constitutional:  Negative for appetite change, chills, fatigue and fever.  Respiratory:  Negative for chest tightness and shortness of breath.   Cardiovascular:  Negative for chest pain and palpitations.  Gastrointestinal:  Positive for abdominal pain (see HPI) and diarrhea (during recent illness; resolved). Negative for nausea and vomiting.  Neurological:  Negative for dizziness and weakness.        Objective    BP 106/62 (BP Location: Left Arm, Patient Position: Sitting, Cuff Size: Normal)   Pulse 85   Resp 16   Ht 5' 2 (1.575 m)   Wt 145 lb (65.8 kg)   LMP 04/16/2015   BMI 26.52 kg/m     Physical Exam Vitals and nursing note reviewed.  Constitutional:      General: She is not in acute distress.    Appearance: Normal appearance.  HENT:     Head: Normocephalic and atraumatic.  Eyes:     General: No scleral icterus.    Conjunctiva/sclera: Conjunctivae normal.  Cardiovascular:     Rate and Rhythm: Normal rate.  Pulmonary:     Effort: Pulmonary effort is normal.  Neurological:     Mental Status: She is alert and oriented to person, place, and time. Mental status is at baseline.  Psychiatric:        Mood and Affect: Mood normal.        Behavior: Behavior normal.      No results found for any visits on 12/10/23.  Assessment & Plan    Type 2 diabetes mellitus with hyperglycemia, without long-term current use of insulin  (HCC) Assessment & Plan: A1c of 10%. On Mounjaro  (tirzepatide ) 2.5 mg with good initial response, including 15-pound weight loss. Recent illness interrupted medication adherence. Discussed increasing Mounjaro  to 5 mg, holding Jardiance  during illness in the future, and monitoring for hypoglycemia. Goal to reassess blood sugar levels and management in 3 months. - Increase Mounjaro  to 5 mg - Hold Jardiance  in future during illness - Schedule follow-up in 3 months to reassess blood sugar levels and  management  Orders: -     Tirzepatide ; Inject 5 mg into the skin once a week.  Dispense: 6 mL; Refill: 0  Moderate recurrent major depression (HCC) Assessment & Plan: Well controlled at this time. Managed by behavioral health; will defer to specialist management. Continue citalopram  40 mg daily Continue buspirone  7.5 mg three times daily Continue lamotrigine  50 mg daily   Esophageal pain Assessment & Plan: Severe vomiting post-stomach virus with difficulty burping and air trapping. Symptoms include pain and discomfort with air swallowing. Differential includes gas entrapment and potential sliding hiatal hernia. Discussed simethicone for gas relief and Protonix  for short-term reflux management. If no improvement, ultrasound may be necessary to rule out hernia. Consider referral to GI. - Recommend simethicone (Gas-X) for gas relief - Prescribe Protonix  (pantoprazole ) for short-term reflux management - Advise to monitor symptoms and return if no improvement for potential ultrasound to rule out hernia  Orders: -  Pantoprazole  Sodium; Take 1 tablet (40 mg total) by mouth daily.  Dispense: 30 tablet; Refill: 0  Vitamin D  deficiency Assessment & Plan: Severe vitamin D  deficiency with level of 10 ng/mL. Currently on high-dose vitamin D  supplementation. Discussed continuing supplementation and reassessing levels in 3-6 months. - Continue high-dose vitamin D  (50,000 IU weekly) - Reassess vitamin D  levels in 3-6 months   Tobacco use Assessment & Plan: Continues to smoke but has reduced frequency. Attempting to taper off. Encouraged continued reduction and provided support and resources for smoking cessation. - Encourage continued reduction in smoking - Provide support and resources for smoking cessation   General Health Maintenance Due for routine screenings and vaccinations. Deferred due to current health issues. Discussed importance of scheduling an eye exam ASAP due to diabetes and  deferring mammogram, Pap smear, and shingles vaccine for a few months per patient preference. - Defer mammogram and Pap smear for a few months per patient preference - Encourage scheduling an eye exam ASAP due to diabetes - Defer shingles vaccine per patient preference   Return in about 3 months (around 03/09/2024) for DM. Return sooner if abdominal pain does not improve.     I discussed the assessment and treatment plan with the patient  The patient was provided an opportunity to ask questions and all were answered. The patient agreed with the plan and demonstrated an understanding of the instructions.   The patient was advised to call back or seek an in-person evaluation if the symptoms worsen or if the condition fails to improve as anticipated.    LAURAINE LOISE BUOY, DO  [facility] [phone] (phone) [phone] (fax)  [facility]

## 2023-12-15 ENCOUNTER — Encounter: Payer: Self-pay | Admitting: Family Medicine

## 2023-12-15 DIAGNOSIS — K2289 Other specified disease of esophagus: Secondary | ICD-10-CM | POA: Insufficient documentation

## 2023-12-15 NOTE — Assessment & Plan Note (Signed)
 Continues to smoke but has reduced frequency. Attempting to taper off. Encouraged continued reduction and provided support and resources for smoking cessation. - Encourage continued reduction in smoking - Provide support and resources for smoking cessation

## 2023-12-15 NOTE — Assessment & Plan Note (Signed)
 Severe vitamin D  deficiency with level of 10 ng/mL. Currently on high-dose vitamin D  supplementation. Discussed continuing supplementation and reassessing levels in 3-6 months. - Continue high-dose vitamin D  (50,000 IU weekly) - Reassess vitamin D  levels in 3-6 months

## 2023-12-15 NOTE — Assessment & Plan Note (Signed)
 Well controlled at this time. Managed by behavioral health; will defer to specialist management. Continue citalopram 40 mg daily Continue buspirone 7.5 mg three times daily Continue lamotrigine 50 mg daily

## 2023-12-15 NOTE — Assessment & Plan Note (Signed)
 A1c of 10%. On Mounjaro  (tirzepatide ) 2.5 mg with good initial response, including 15-pound weight loss. Recent illness interrupted medication adherence. Discussed increasing Mounjaro  to 5 mg, holding Jardiance  during illness in the future, and monitoring for hypoglycemia. Goal to reassess blood sugar levels and management in 3 months. - Increase Mounjaro  to 5 mg - Hold Jardiance  in future during illness - Schedule follow-up in 3 months to reassess blood sugar levels and management

## 2023-12-15 NOTE — Assessment & Plan Note (Signed)
 Severe vomiting post-stomach virus with difficulty burping and air trapping. Symptoms include pain and discomfort with air swallowing. Differential includes gas entrapment and potential sliding hiatal hernia. Discussed simethicone for gas relief and Protonix  for short-term reflux management. If no improvement, ultrasound may be necessary to rule out hernia. Consider referral to GI. - Recommend simethicone (Gas-X) for gas relief - Prescribe Protonix  (pantoprazole ) for short-term reflux management - Advise to monitor symptoms and return if no improvement for potential ultrasound to rule out hernia

## 2023-12-20 ENCOUNTER — Telehealth: Payer: 59 | Admitting: Emergency Medicine

## 2023-12-20 ENCOUNTER — Ambulatory Visit: Payer: Self-pay

## 2023-12-20 DIAGNOSIS — J069 Acute upper respiratory infection, unspecified: Secondary | ICD-10-CM

## 2023-12-20 MED ORDER — BENZONATATE 100 MG PO CAPS: 100.0000 mg | ORAL_CAPSULE | Freq: Two times a day (BID) | ORAL | 0 refills | Status: DC | PRN

## 2023-12-20 NOTE — Progress Notes (Signed)
Virtual Visit Consent   Michelle Hendricks, you are scheduled for a virtual visit with a [facility] provider today. Just as with appointments in the office, your consent must be obtained to participate. Your consent will be active for this visit and any virtual visit you may have with one of our providers in the next 365 days. If you have a MyChart account, a copy of this consent can be sent to you electronically.  As this is a virtual visit, video technology does not allow for your provider to perform a traditional examination. This may limit your provider's ability to fully assess your condition. If your provider identifies any concerns that need to be evaluated in person or the need to arrange testing (such as labs, EKG, etc.), we will make arrangements to do so. Although advances in technology are sophisticated, we cannot ensure that it will always work on either your end or our end. If the connection with a video visit is poor, the visit may have to be switched to a telephone visit. With either a video or telephone visit, we are not always able to ensure that we have a secure connection.  By engaging in this virtual visit, you consent to the provision of healthcare and authorize for your insurance to be billed (if applicable) for the services provided during this visit. Depending on your insurance coverage, you may receive a charge related to this service.  I need to obtain your verbal consent now. Are you willing to proceed with your visit today? Michelle Hendricks has provided verbal consent on 12/20/2023 for a virtual visit (video or telephone). [name], PA-C  Date: 12/20/2023 10:07 AM  Virtual Visit via Video Note   I, [name], connected with  Michelle Hendricks  ([id number], 1966/01/28) on 12/20/23 at 10:00 AM EST by a video-enabled telemedicine application and verified that I am speaking with the correct person using two identifiers.  Location: Patient: Virtual Visit Location Patient:  Home Provider: Virtual Visit Location Provider: Home Office   I discussed the limitations of evaluation and management by telemedicine and the availability of in person appointments. The patient expressed understanding and agreed to proceed.    History of Present Illness: Michelle Hendricks is a 58 y.o. who identifies as a female who was assigned female at birth, and is being seen today for cough x 1 day.  Onset was yesterday.  Denies having taking anything for the symptoms.  Has had subjective fevers and chills.  Denies any known sick contacts.  Hx of sports induced asthma. Marland Kitchen  HPI: HPI  Problems:  Patient Active Problem List   Diagnosis Date Noted   Esophageal pain 12/15/2023   Perennial allergic rhinitis with seasonal variation 08/14/2023   Vitamin D deficiency 08/14/2023   Abnormal serum thyroid stimulating hormone (TSH) level 08/14/2023   Elevated hemoglobin (HCC) 07/11/2022   Severe sepsis (HCC) 07/09/2022   Lactic acidosis 07/09/2022   Hyperglycemia    Nausea & vomiting 07/08/2022   Abdominal pain 07/08/2022   DKA (diabetic ketoacidosis) (HCC) 07/08/2022   Moderate recurrent major depression (HCC) 03/13/2022   Type 2 diabetes mellitus with hyperglycemia (HCC) 03/13/2022   Hyperlipidemia associated with type 2 diabetes mellitus (HCC) 03/13/2022   Hot flashes 03/13/2022   Tobacco use 05/27/2017   Depression 06/15/2015   Anxiety 06/15/2015   Hypertension 06/15/2015   DDD (degenerative disc disease), cervical 06/15/2015   ADD (attention deficit disorder) 06/15/2015    Allergies:  Allergies  Allergen Reactions  Penicillins Rash and Other (See Comments)    Has patient had a PCN reaction causing immediate rash, facial/tongue/throat swelling, SOB or lightheadedness with hypotension: No Has patient had a PCN reaction causing severe rash involving mucus membranes or skin necrosis: No Has patient had a PCN reaction that required hospitalization No Has patient had a PCN reaction occurring  within the last 10 years: No If all of the above answers are "NO", then may proceed with Cephalosporin use.   Medications:  Current Outpatient Medications:    benzonatate (TESSALON) 100 MG capsule, Take 1 capsule (100 mg total) by mouth 2 (two) times daily as needed for cough., Disp: 20 capsule, Rfl: 0   Blood Glucose Monitoring Suppl DEVI, 1 each by Does not apply route in the morning, at noon, and at bedtime. May substitute to any manufacturer covered by patient's insurance., Disp: 1 each, Rfl: 0   busPIRone (BUSPAR) 7.5 MG tablet, Take 1 tablet (7.5 mg total) by mouth 3 (three) times daily., Disp: 30 tablet, Rfl: 0   Choline Fenofibrate (FENOFIBRIC ACID) 135 MG CPDR, Take 1 capsule by mouth daily., Disp: 90 capsule, Rfl: 1   citalopram (CELEXA) 40 MG tablet, TAKE 1 TABLET BY MOUTH EVERY DAY, Disp: 90 tablet, Rfl: 0   empagliflozin (JARDIANCE) 10 MG TABS tablet, TAKE 1 TABLET BY MOUTH EVERY DAY BEFORE BREAKFAST, Disp: 90 tablet, Rfl: 3   Glucose Blood (BLOOD GLUCOSE TEST STRIPS) STRP, 1 each by In Vitro route in the morning, at noon, and at bedtime. May substitute to any manufacturer covered by patient's insurance., Disp: 100 strip, Rfl: 2   lamoTRIgine (LAMICTAL) 25 MG tablet, Take 2 tablets (50 mg total) by mouth daily., Disp: 180 tablet, Rfl: 0   Lancet Device MISC, 1 each by Does not apply route in the morning, at noon, and at bedtime. May substitute to any manufacturer covered by patient's insurance., Disp: 1 each, Rfl: 0   Lancets Misc. MISC, 1 each by Does not apply route in the morning, at noon, and at bedtime. May substitute to any manufacturer covered by patient's insurance., Disp: 100 each, Rfl: 2   levocetirizine (XYZAL) 5 MG tablet, Take 1 tablet (5 mg total) by mouth every evening., Disp: 30 tablet, Rfl: 11   lisinopril (ZESTRIL) 20 MG tablet, Take 1 tablet (20 mg total) by mouth daily., Disp: 90 tablet, Rfl: 3   ondansetron (ZOFRAN-ODT) 4 MG disintegrating tablet, Take 1 tablet (4  mg total) by mouth every 8 (eight) hours as needed for nausea or vomiting., Disp: 20 tablet, Rfl: 0   pantoprazole (PROTONIX) 40 MG tablet, Take 1 tablet (40 mg total) by mouth daily., Disp: 30 tablet, Rfl: 0   simvastatin (ZOCOR) 80 MG tablet, Take 1 tablet (80 mg total) by mouth daily., Disp: 90 tablet, Rfl: 1   tirzepatide (MOUNJARO) 5 MG/0.5ML Pen, Inject 5 mg into the skin once a week., Disp: 6 mL, Rfl: 0   Vitamin D, Ergocalciferol, (DRISDOL) 1.25 MG (50000 UNIT) CAPS capsule, Take 1 capsule (50,000 Units total) by mouth every 7 (seven) days., Disp: 12 capsule, Rfl: 1  Observations/Objective: Patient is well-developed, well-nourished in no acute distress. Coughing during exam Resting comfortably at home.  Head is normocephalic, atraumatic.  No labored breathing.  Speech is clear and coherent with logical content.  Patient is alert and oriented at baseline.    Assessment and Plan: 1. Upper respiratory tract infection, unspecified type (Primary)   Patient with cough x 1 day.  No measured fevers.  Likely viral with only one day of illness.  Appears non-toxic on camera.  Supportive care advised.  Meds ordered this encounter  Medications   benzonatate (TESSALON) 100 MG capsule    Sig: Take 1 capsule (100 mg total) by mouth 2 (two) times daily as needed for cough.    Dispense:  20 capsule    Refill:  0    Supervising Provider:   Merrilee Jansky [[id number]]     Follow Up Instructions: I discussed the assessment and treatment plan with the patient. The patient was provided an opportunity to ask questions and all were answered. The patient agreed with the plan and demonstrated an understanding of the instructions.  A copy of instructions were sent to the patient via MyChart unless otherwise noted below.     The patient was advised to call back or seek an in-person evaluation if the symptoms worsen or if the condition fails to improve as anticipated.    [name], PA-C

## 2023-12-20 NOTE — Patient Instructions (Signed)
Michelle Hendricks, thank you for joining [name], PA-C for today's virtual visit.  While this provider is not your primary care provider (PCP), if your PCP is located in our provider database this encounter information will be shared with them immediately following your visit.   A [facility] MyChart account gives you access to today's visit and all your visits, tests, and labs performed at [facility] " click here if you don't have a [facility] MyChart account or go to [email or web address]  Consent: (Patient) Michelle Hendricks provided verbal consent for this virtual visit at the beginning of the encounter.  Current Medications:  Current Outpatient Medications:    benzonatate (TESSALON) 100 MG capsule, Take 1 capsule (100 mg total) by mouth 2 (two) times daily as needed for cough., Disp: 20 capsule, Rfl: 0   Blood Glucose Monitoring Suppl DEVI, 1 each by Does not apply route in the morning, at noon, and at bedtime. May substitute to any manufacturer covered by patient's insurance., Disp: 1 each, Rfl: 0   busPIRone (BUSPAR) 7.5 MG tablet, Take 1 tablet (7.5 mg total) by mouth 3 (three) times daily., Disp: 30 tablet, Rfl: 0   Choline Fenofibrate (FENOFIBRIC ACID) 135 MG CPDR, Take 1 capsule by mouth daily., Disp: 90 capsule, Rfl: 1   citalopram (CELEXA) 40 MG tablet, TAKE 1 TABLET BY MOUTH EVERY DAY, Disp: 90 tablet, Rfl: 0   empagliflozin (JARDIANCE) 10 MG TABS tablet, TAKE 1 TABLET BY MOUTH EVERY DAY BEFORE BREAKFAST, Disp: 90 tablet, Rfl: 3   Glucose Blood (BLOOD GLUCOSE TEST STRIPS) STRP, 1 each by In Vitro route in the morning, at noon, and at bedtime. May substitute to any manufacturer covered by patient's insurance., Disp: 100 strip, Rfl: 2   lamoTRIgine (LAMICTAL) 25 MG tablet, Take 2 tablets (50 mg total) by mouth daily., Disp: 180 tablet, Rfl: 0   Lancet Device MISC, 1 each by Does not apply route in the morning, at noon, and at bedtime. May substitute to any manufacturer  covered by patient's insurance., Disp: 1 each, Rfl: 0   Lancets Misc. MISC, 1 each by Does not apply route in the morning, at noon, and at bedtime. May substitute to any manufacturer covered by patient's insurance., Disp: 100 each, Rfl: 2   levocetirizine (XYZAL) 5 MG tablet, Take 1 tablet (5 mg total) by mouth every evening., Disp: 30 tablet, Rfl: 11   lisinopril (ZESTRIL) 20 MG tablet, Take 1 tablet (20 mg total) by mouth daily., Disp: 90 tablet, Rfl: 3   ondansetron (ZOFRAN-ODT) 4 MG disintegrating tablet, Take 1 tablet (4 mg total) by mouth every 8 (eight) hours as needed for nausea or vomiting., Disp: 20 tablet, Rfl: 0   pantoprazole (PROTONIX) 40 MG tablet, Take 1 tablet (40 mg total) by mouth daily., Disp: 30 tablet, Rfl: 0   simvastatin (ZOCOR) 80 MG tablet, Take 1 tablet (80 mg total) by mouth daily., Disp: 90 tablet, Rfl: 1   tirzepatide (MOUNJARO) 5 MG/0.5ML Pen, Inject 5 mg into the skin once a week., Disp: 6 mL, Rfl: 0   Vitamin D, Ergocalciferol, (DRISDOL) 1.25 MG (50000 UNIT) CAPS capsule, Take 1 capsule (50,000 Units total) by mouth every 7 (seven) days., Disp: 12 capsule, Rfl: 1   Medications ordered in this encounter:  Meds ordered this encounter  Medications   benzonatate (TESSALON) 100 MG capsule    Sig: Take 1 capsule (100 mg total) by mouth 2 (two) times daily as needed for cough.    Dispense:  20 capsule    Refill:  0    Supervising Provider:   Merrilee Jansky [[id number]]     *If you need refills on other medications prior to your next appointment, please contact your pharmacy*  Follow-Up: Call back or seek an in-person evaluation if the symptoms worsen or if the condition fails to improve as anticipated.  [facility] [phone]  Other Instructions    If you have been instructed to have an in-person evaluation today at a local Urgent Care facility, please use the link below. It will take you to a list of all of our available [facility], including address, phone number and hours of operation. Please do not delay care.  [facility]  If you or a family member do not have a primary care provider, use the link below to schedule a visit and establish care. When you choose a [facility] primary care physician or advanced practice provider, you gain a long-term partner in health. Find a Primary Care Provider  Learn more about [facility]'s in-office and virtual care options:  - Get Care Now

## 2023-12-20 NOTE — Telephone Encounter (Signed)
Summary: Seeking appt, symptomatic, nothing available   No appt available, pt has been vomiting, coughing, feverish       Chief Complaint: Cough Symptoms: Above Frequency: Yesterday Pertinent Negatives: Patient denies SOB Disposition: [] ED /[] Urgent Care (no appt availability in office) / [] Appointment(In office/virtual)/ [x]  [facility]/ [] Home Care/ [] Refused Recommended Disposition /[] [facility]/ []  Follow-up with PCP Additional Notes: agrees with appointment.  Reason for Disposition  [1] Continuous (nonstop) coughing interferes with work or school AND [2] no improvement using cough treatment per Care Advice  Answer Assessment - Initial Assessment Questions 1. ONSET: "When did the cough begin?"      Yesterday 2. SEVERITY: "How bad is the cough today?"      Moderate 3. SPUTUM: "Describe the color of your sputum" (none, dry cough; clear, white, yellow, green)     None 4. HEMOPTYSIS: "Are you coughing up any blood?" If so ask: "How much?" (flecks, streaks, tablespoons, etc.)     No 5. DIFFICULTY BREATHING: "Are you having difficulty breathing?" If Yes, ask: "How bad is it?" (e.g., mild, moderate, severe)    - MILD: No SOB at rest, mild SOB with walking, speaks normally in sentences, can lie down, no retractions, pulse < 100.    - MODERATE: SOB at rest, SOB with minimal exertion and prefers to sit, cannot lie down flat, speaks in phrases, mild retractions, audible wheezing, pulse 100-120.    - SEVERE: Very SOB at rest, speaks in single words, struggling to breathe, sitting hunched forward, retractions, pulse > 120      No 6. FEVER: "Do you have a fever?" If Yes, ask: "What is your temperature, how was it measured, and when did it start?"     No 7. CARDIAC HISTORY: "Do you have any history of heart disease?" (e.g., heart attack, congestive heart failure)      No 8. LUNG HISTORY: "Do you have any history of lung disease?"  (e.g., pulmonary embolus, asthma,  emphysema)     No 9. PE RISK FACTORS: "Do you have a history of blood clots?" (or: recent major surgery, recent prolonged travel, bedridden)     No 10. OTHER SYMPTOMS: "Do you have any other symptoms?" (e.g., runny nose, wheezing, chest pain)       Wheezing 11. PREGNANCY: "Is there any chance you are pregnant?" "When was your last menstrual period?"       No 12. TRAVEL: "Have you traveled out of the country in the last month?" (e.g., travel history, exposures)       No  Protocols used: Cough - Acute Non-Productive-A-AH

## 2024-01-01 ENCOUNTER — Other Ambulatory Visit: Payer: Self-pay | Admitting: Family Medicine

## 2024-01-01 DIAGNOSIS — K2289 Other specified disease of esophagus: Secondary | ICD-10-CM

## 2024-02-06 ENCOUNTER — Encounter (HOSPITAL_COMMUNITY): Payer: Self-pay | Admitting: Psychiatry

## 2024-02-06 ENCOUNTER — Ambulatory Visit (HOSPITAL_COMMUNITY): Payer: 59 | Admitting: Psychiatry

## 2024-02-06 VITALS — BP 106/60 | HR 80 | Ht 62.0 in | Wt 130.0 lb

## 2024-02-06 DIAGNOSIS — F419 Anxiety disorder, unspecified: Secondary | ICD-10-CM

## 2024-02-06 DIAGNOSIS — F411 Generalized anxiety disorder: Secondary | ICD-10-CM

## 2024-02-06 DIAGNOSIS — F321 Major depressive disorder, single episode, moderate: Secondary | ICD-10-CM | POA: Diagnosis not present

## 2024-02-06 DIAGNOSIS — F32A Depression, unspecified: Secondary | ICD-10-CM | POA: Diagnosis not present

## 2024-02-06 MED ORDER — CITALOPRAM HYDROBROMIDE 40 MG PO TABS: ORAL_TABLET | ORAL | 0 refills | Status: DC

## 2024-02-06 MED ORDER — LAMOTRIGINE 25 MG PO TABS: 50.0000 mg | ORAL_TABLET | Freq: Every day | ORAL | 0 refills | Status: DC
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
## 2024-02-07 ENCOUNTER — Ambulatory Visit: Payer: Self-pay | Admitting: Family Medicine

## 2024-02-10 ENCOUNTER — Other Ambulatory Visit: Payer: Self-pay | Admitting: Physician Assistant

## 2024-02-10 ENCOUNTER — Telehealth: Payer: Self-pay | Admitting: Family Medicine

## 2024-02-10 ENCOUNTER — Encounter: Payer: Self-pay | Admitting: Physician Assistant

## 2024-02-10 ENCOUNTER — Ambulatory Visit: Admitting: Physician Assistant

## 2024-02-10 VITALS — HR 103 | Temp 97.5°F | Resp 16 | Ht 62.0 in | Wt 124.2 lb

## 2024-02-10 DIAGNOSIS — Z7985 Long-term (current) use of injectable non-insulin antidiabetic drugs: Secondary | ICD-10-CM

## 2024-02-10 DIAGNOSIS — E1165 Type 2 diabetes mellitus with hyperglycemia: Secondary | ICD-10-CM

## 2024-02-10 DIAGNOSIS — R634 Abnormal weight loss: Secondary | ICD-10-CM

## 2024-02-10 DIAGNOSIS — R110 Nausea: Secondary | ICD-10-CM

## 2024-02-10 MED ORDER — TIRZEPATIDE 2.5 MG/0.5ML ~~LOC~~ SOAJ
2.5000 mg | SUBCUTANEOUS | 1 refills | Status: DC
Start: 2024-02-10 — End: 2024-03-09

## 2024-02-10 NOTE — Telephone Encounter (Signed)
 Copied from CRM [id number]. Topic: Clinical - Medication Question >> Feb 10, 2024  3:17 PM Marlow Baars wrote: Reason for CRM: Tammy with [facility] called in needing clarification on a prescription for [facility] that was sent in. I will transfer to [facility] NT  Patient has picked up 3 out of the 5 doses and pharmacist states new prior auth needs to be completed.

## 2024-02-10 NOTE — Progress Notes (Unsigned)
 Established patient visit  Patient: Michelle Hendricks   DOB: 1966/10/02   58 y.o. Female  MRN: [id number] Visit Date: 02/10/2024  Today's healthcare provider: Debera Lat, PA-C   Chief Complaint  Patient presents with   Nausea    :Pt thinks it from the monjuro injection. Not eating, Losing wt to fast   Subjective      Discussed the use of AI scribe software for clinical note transcription with the patient, who gave verbal consent to proceed.  History of Present Illness   The patient, with a history of diabetes, presents with rapid weight loss and nausea. She reports losing approximately 30 pounds over the past four months, which she attributes to a decreased appetite due to nausea. She expresses concern about the rapidity of the weight loss and the impact of the nausea on her ability to eat.  The patient is currently on a low dose of Mounjaro for diabetes management, which she started prior to the onset of the weight loss and nausea. She was aware that weight loss could be a side effect of the medication, but did not anticipate the severity of the weight loss she experienced. She notes that her blood sugar levels have improved since starting the medication, with readings ranging between 100 and 150, although her morning readings tend to be on the higher end.  The patient also mentions a previous stomach bug, which she believes may have exacerbated her symptoms. She expresses a desire to manage the nausea so she can resume eating normally.           02/10/2024    2:08 PM 11/08/2023    1:50 PM 05/14/2023   10:35 AM  Depression screen PHQ 2/9  Decreased Interest 1 1 1   Down, Depressed, Hopeless 1 1 1   PHQ - 2 Score 2 2 2   Altered sleeping 2 1 2   Tired, decreased energy 2 1 2   Change in appetite 3 1 1   Feeling bad or failure about yourself  0 0 1  Trouble concentrating 1 2 1   Moving slowly or fidgety/restless 1 1 1   Suicidal thoughts 0 0 0  PHQ-9 Score 11 8 10   Difficult doing  work/chores  Somewhat difficult Somewhat difficult      02/10/2024    2:08 PM 11/08/2023    1:51 PM 03/13/2022    3:50 PM 10/07/2020    4:29 PM  GAD 7 : Generalized Anxiety Score  Nervous, Anxious, on Edge 1 2 2 1   Control/stop worrying 1 2 2 1   Worry too much - different things 1 2 2  0  Trouble relaxing 1 1 3 1   Restless 1 1 1 1   Easily annoyed or irritable 1 1 3 1   Afraid - awful might happen 1 0 0 1  Total GAD 7 Score 7 9 13 6   Anxiety Difficulty Somewhat difficult Somewhat difficult Very difficult Somewhat difficult    Medications: Outpatient Medications Prior to Visit  Medication Sig   benzonatate (TESSALON) 100 MG capsule Take 1 capsule (100 mg total) by mouth 2 (two) times daily as needed for cough.   Blood Glucose Monitoring Suppl DEVI 1 each by Does not apply route in the morning, at noon, and at bedtime. May substitute to any manufacturer covered by patient's insurance.   Choline Fenofibrate (FENOFIBRIC ACID) 135 MG CPDR Take 1 capsule by mouth daily.   citalopram (CELEXA) 40 MG tablet TAKE 1 TABLET BY MOUTH EVERY DAY   empagliflozin (JARDIANCE) 10 MG TABS  tablet TAKE 1 TABLET BY MOUTH EVERY DAY BEFORE BREAKFAST   Glucose Blood (BLOOD GLUCOSE TEST STRIPS) STRP 1 each by In Vitro route in the morning, at noon, and at bedtime. May substitute to any manufacturer covered by patient's insurance.   lamoTRIgine (LAMICTAL) 25 MG tablet Take 2 tablets (50 mg total) by mouth daily.   levocetirizine (XYZAL) 5 MG tablet Take 1 tablet (5 mg total) by mouth every evening.   lisinopril (ZESTRIL) 20 MG tablet Take 1 tablet (20 mg total) by mouth daily.   ondansetron (ZOFRAN-ODT) 4 MG disintegrating tablet Take 1 tablet (4 mg total) by mouth every 8 (eight) hours as needed for nausea or vomiting.   pantoprazole (PROTONIX) 40 MG tablet TAKE 1 TABLET BY MOUTH EVERY DAY   simvastatin (ZOCOR) 80 MG tablet Take 1 tablet (80 mg total) by mouth daily.   tirzepatide [facility]) 5 MG/0.5ML Pen Inject 5  mg into the skin once a week.   Vitamin D, Ergocalciferol, (DRISDOL) 1.25 MG (50000 UNIT) CAPS capsule Take 1 capsule (50,000 Units total) by mouth every 7 (seven) days.   Lancet Device MISC 1 each by Does not apply route in the morning, at noon, and at bedtime. May substitute to any manufacturer covered by patient's insurance.   Lancets Misc. MISC 1 each by Does not apply route in the morning, at noon, and at bedtime. May substitute to any manufacturer covered by patient's insurance.   No facility-administered medications prior to visit.    Review of Systems All negative Except see HPI       Objective    Pulse (!) 103   Temp (!) 97.5 F (36.4 C) (Oral)   Resp 16   Ht 5\' 2"  (1.575 m)   Wt 124 lb 3.2 oz (56.3 kg)   LMP 04/16/2015   SpO2 98%   BMI 22.72 kg/m     Physical Exam Vitals reviewed.  Constitutional:      General: She is not in acute distress.    Appearance: Normal appearance. She is well-developed. She is not diaphoretic.  HENT:     Head: Normocephalic and atraumatic.  Eyes:     General: No scleral icterus.    Conjunctiva/sclera: Conjunctivae normal.  Neck:     Thyroid: No thyromegaly.  Cardiovascular:     Rate and Rhythm: Normal rate and regular rhythm.     Pulses: Normal pulses.     Heart sounds: Normal heart sounds. No murmur heard. Pulmonary:     Effort: Pulmonary effort is normal. No respiratory distress.     Breath sounds: Normal breath sounds. No wheezing, rhonchi or rales.  Musculoskeletal:     Cervical back: Neck supple.     Right lower leg: No edema.     Left lower leg: No edema.  Lymphadenopathy:     Cervical: No cervical adenopathy.  Skin:    General: Skin is warm and dry.     Findings: No rash.  Neurological:     Mental Status: She is alert and oriented to person, place, and time. Mental status is at baseline.  Psychiatric:        Mood and Affect: Mood normal.        Behavior: Behavior normal.      No results found for any visits  on 02/10/24.      Assessment and Plan   Type 2 diabetes mellitus with hyperglycemia, without long-term current use of insulin (HCC) (Primary) chronic Improved blood glucose control with Mounjaro, but side  effects present. Dose reduction aims to balance control and side effects. - Continue Mounjaro at 2.5 mg. - Encourage low-carb dietary adjustments. - Order Greggory Keen prescription refill. - Discuss with primary care provider in two weeks to evaluate management and tolerance.  - tirzepatide [facility]) 2.5 MG/0.5ML Pen; Inject 2.5 mg into the skin once a week.  Dispense: 2 mL; Refill: 1  Nausea Weight loss Unintentional Weight Loss Significant weight loss due to Mounjaro, causing nausea and reduced appetite. Dose reduction may alleviate symptoms. - Decrease Mounjaro dose to 2.5 mg and monitor response. - Encourage dietary modifications: sour foods, fresh fruits, salads, smoothies. - Advise increased water intake. - Schedule follow-up in two weeks to reassess weight and symptoms.     No orders of the defined types were placed in this encounter.   Return in about 2 weeks (around 02/24/2024) for dm fu.   The patient was advised to call back or seek an in-person evaluation if the symptoms worsen or if the condition fails to improve as anticipated.  I discussed the assessment and treatment plan with the patient. The patient was provided an opportunity to ask questions and all were answered. The patient agreed with the plan and demonstrated an understanding of the instructions.  I, Debera Lat, PA-C have reviewed all documentation for this visit. The documentation on 02/10/2024  for the exam, diagnosis, procedures, and orders are all accurate and complete.  Debera Lat, [facility], MMS [facility] [phone] (phone) [phone] (fax)  [facility]

## 2024-02-11 ENCOUNTER — Other Ambulatory Visit (HOSPITAL_COMMUNITY): Payer: Self-pay

## 2024-02-11 ENCOUNTER — Telehealth: Payer: Self-pay

## 2024-02-11 NOTE — Telephone Encounter (Signed)
 Pharmacy Patient Advocate Encounter   Received notification from Physician's Office that prior authorization for Mounjaro 2.5  is required/requested.   Insurance verification completed.   The patient is insured through [facility] .   Per test claim: The current 84 day co-pay is, $25.00.  No PA needed at this time. This test claim was processed through [facility]- copay amounts may vary at other pharmacies due to pharmacy/plan contracts, or as the patient moves through the different stages of their insurance plan.

## 2024-02-11 NOTE — Telephone Encounter (Signed)
 Requested medications are due for refill today.  See note  Requested medications are on the active medications list.  Yes -  at 2 different dosages  Last refill. 2.5 mg 02/10/2024. 5mg  12/10/2023  Future visit scheduled.   yes  Notes to clinic.  Pharmacy comment: Alternative Requested:PT PICKED UP 3 MONTH SUPPLY OF 5 MG IN JANUARY, INS DOESNT WANT TO PAY EARLY FOR LOWER DOSE. PLEASE TRY TO OBTAIN PA FROM INSURANCE. SPOKE TO CANDICE AT OFFICE.    Requested Prescriptions  Pending Prescriptions Disp Refills   MOUNJARO 2.5 MG/0.5ML Pen [Pharmacy Med Name: MOUNJARO 2.5 MG/0.5 ML PEN]  1    Sig: INJECT 2.5 MG SUBCUTANEOUSLY WEEKLY     Off-Protocol Failed - 02/11/2024  3:20 PM      Failed - Medication not assigned to a protocol, review manually.      Passed - Valid encounter within last 12 months    Recent Outpatient Visits           2 months ago Type 2 diabetes mellitus with hyperglycemia, without long-term current use of insulin (HCC)   [facility] Wall, Monico Blitz, DO   3 months ago Type 2 diabetes mellitus with hyperglycemia, without long-term current use of insulin [facility])   [place] [facility] [place], Monico Blitz, DO   6 months ago Hyperlipidemia associated with type 2 diabetes mellitus [facility])   [place] [facility] [place], Monico Blitz, DO   9 months ago Type 2 diabetes mellitus with hyperglycemia, with long-term current use of insulin [facility])   [facility] [place], Monico Blitz, DO   1 year ago COVID-19   [facility] Alfredia Ferguson, PA-C       Future Appointments             In 3 weeks P[place], Monico Blitz, DO [facility]

## 2024-02-13 ENCOUNTER — Telehealth: Payer: Self-pay

## 2024-02-13 NOTE — Telephone Encounter (Signed)
 Copied from CRM [id number]. Topic: Clinical - Prescription Issue >> Feb 13, 2024 12:35 PM Michelle Hendricks wrote: Reason for CRM: Patient is calling in cause her prescription is not available for [facility], she wanting to see if it needs a prior auth. [phone].

## 2024-02-14 NOTE — Telephone Encounter (Signed)
 Patient notified via mychart. Please see telephone encounter from 02/13/24

## 2024-02-18 ENCOUNTER — Ambulatory Visit: Payer: Self-pay | Admitting: Family Medicine

## 2024-02-18 ENCOUNTER — Ambulatory Visit: Admitting: Family Medicine

## 2024-02-18 ENCOUNTER — Encounter: Payer: Self-pay | Admitting: Family Medicine

## 2024-02-18 VITALS — BP 144/72 | HR 81 | Resp 16 | Ht 62.0 in | Wt 139.0 lb

## 2024-02-18 DIAGNOSIS — Z7985 Long-term (current) use of injectable non-insulin antidiabetic drugs: Secondary | ICD-10-CM | POA: Diagnosis not present

## 2024-02-18 DIAGNOSIS — E1165 Type 2 diabetes mellitus with hyperglycemia: Secondary | ICD-10-CM | POA: Diagnosis not present

## 2024-02-18 DIAGNOSIS — R609 Edema, unspecified: Secondary | ICD-10-CM | POA: Diagnosis not present

## 2024-02-18 NOTE — Telephone Encounter (Signed)
 Copied from CRM [id number]. Topic: Clinical - Red Word Triage >> Feb 18, 2024  8:32 AM Nyra Capes wrote: Red Word that prompted transfer to Nurse Triage: Patient ankles are swollen started Saturday, woke up this morning face, hands arms, legs belly, whole body is now swollen, patient took water pill 4 total yesterday and 2 this morning. No other symptoms  Chief Complaint: Swelling Symptoms:Swelling of hands, legs and face Frequency: Since Saturday Pertinent Negatives: Patient denies fever Disposition: [] ED /[] Urgent Care (no appt availability in office) / [x] Appointment(In office/virtual)/ []  [facility]/ [] Home Care/ [] Refused Recommended Disposition /[] [place] [facility]/ []  Follow-up with PCP Additional Notes: Patient called in to report a new onset of swelling that started on Saturday. Patient stated she is experiencing swelling of bilateral hands, bilateral legs and her face. Patient stated she used to experience swelling and bloating similar to this when she was on her period. Patient stated her right thigh was slightly aching/throbbing yesterday. Patient denied pain at this time. Patient stated her bilateral feet are slightly red/purple. Patient denied redness elsewhere. Patient denied fever. Patient denied chest pain and difficulty breathing. Patient stated the swelling feels like fluid retention. Patient stated she is still able to walk around. This RN advised patient to see a provider within 4 hours, per protocol. No availability with PCP. This RN scheduled patient with an alternate provider in the office this morning. This RN advised patient to call back if symptoms worsen. Patient complied.   Reason for Disposition  SEVERE leg swelling (e.g., swelling extends above knee, entire leg is swollen, weeping fluid)  Answer Assessment - Initial Assessment Questions 1. ONSET: "When did the swelling start?" (e.g., minutes, hours, days)     Over the weekend 2. LOCATION: "What part of  the leg is swollen?"  "Are both legs swollen or just one leg?"     Bilateral hands, bilateral ankles and legs, face 3. SEVERITY: "How bad is the swelling?" (e.g., localized; mild, moderate, severe)   - Localized: Small area of swelling localized to one leg.   - MILD pedal edema: Swelling limited to foot and ankle, pitting edema < 1/4 inch (6 mm) deep, rest and elevation eliminate most or all swelling.   - MODERATE edema: Swelling of lower leg to knee, pitting edema > 1/4 inch (6 mm) deep, rest and elevation only partially reduce swelling.   - SEVERE edema: Swelling extends above knee, facial or hand swelling present.      States she could not see her ankles yesterday at all, but she can see them today, states swelling extends above the knees 4. REDNESS: "Does the swelling look red or infected?"     Denies 5. PAIN: "Is the swelling painful to touch?" If Yes, ask: "How painful is it?"   (Scale 1-10; mild, moderate or severe)     States front of right thigh was "throbbing" yesterday, denies pain at this time, denies pain elsewhere 6. FEVER: "Do you have a fever?" If Yes, ask: "What is it, how was it measured, and when did it start?"      Denies 7. CAUSE: "What do you think is causing the leg swelling?"     Denies 8. MEDICAL HISTORY: "Do you have a history of blood clots (e.g., DVT), cancer, heart failure, kidney disease, or liver failure?"     Denies 9. RECURRENT SYMPTOM: "Have you had leg swelling before?" If Yes, ask: "When was the last time?" "What happened that time?"    States  this has not happened since she has had a period, states she used to retain a lot of fluid and would swell while she was on her period 61. OTHER SYMPTOMS: "Do you have any other symptoms?" (e.g., chest pain, difficulty breathing)     Denies chest pain, denies difficulty breathing, states she is still able to walk around  Protocols used: Leg Swelling and Edema-A-AH

## 2024-02-18 NOTE — Progress Notes (Signed)
 Established patient visit   Patient: Michelle Hendricks   DOB: 04/13/1966   58 y.o. Female  MRN: [id number] Visit Date: 02/18/2024  Today's healthcare provider: Mila Merry, MD   Chief Complaint  Patient presents with   Edema    Frequency: since Saturday Symptoms: Edema all over Denies SOB   Subjective    Ambient AI software was used to assist with clinical note transcription.   History of Present Illness   Michelle Hendricks is a 58 year old female who presents with generalized swelling.  She has been experiencing generalized swelling that began over the weekend, initially noticed in her hands and later affecting the entire body. The swelling is bilateral and similar to what she used to experience during her menstrual periods. She took over-the-counter diuretics with some improvement. Her toes appear slightly purple, which is unusual for her, and she has mild aching pain associated with the swelling resolving. No significant changes in diet, such as increased salt intake, but she has been drinking more Gatorade recently due to nausea.  Regarding her medication history, she recently reduced her Mounjaro dose from 5 mg to 2.5 mg due to nausea, which previously caused her to miss work. Since reducing the dose, she has not experienced nausea. She stopped taking Jardiance in November due to concerns about overlapping side effects with Mounjaro. Her blood sugar levels have improved with these medication adjustments.  She denies breathing difficulties. No fever, chills, sweats, joint pain, or recent travel. She denies any issues with urination or gastrointestinal bleeding.  She smokes about half a pack of cigarettes a day and does not consume alcohol regularly.       Medications: Outpatient Medications Prior to Visit  Medication Sig   Blood Glucose Monitoring Suppl DEVI 1 each by Does not apply route in the morning, at noon, and at bedtime. May substitute to any manufacturer covered by  patient's insurance.   Choline Fenofibrate (FENOFIBRIC ACID) 135 MG CPDR Take 1 capsule by mouth daily.   citalopram (CELEXA) 40 MG tablet TAKE 1 TABLET BY MOUTH EVERY DAY   Glucose Blood (BLOOD GLUCOSE TEST STRIPS) STRP 1 each by In Vitro route in the morning, at noon, and at bedtime. May substitute to any manufacturer covered by patient's insurance.   lamoTRIgine (LAMICTAL) 25 MG tablet Take 2 tablets (50 mg total) by mouth daily.   levocetirizine (XYZAL) 5 MG tablet Take 1 tablet (5 mg total) by mouth every evening.   lisinopril (ZESTRIL) 20 MG tablet Take 1 tablet (20 mg total) by mouth daily.   pantoprazole (PROTONIX) 40 MG tablet TAKE 1 TABLET BY MOUTH EVERY DAY   simvastatin (ZOCOR) 80 MG tablet Take 1 tablet (80 mg total) by mouth daily.   tirzepatide [facility]) 2.5 MG/0.5ML Pen Inject 2.5 mg into the skin once a week.   Vitamin D, Ergocalciferol, (DRISDOL) 1.25 MG (50000 UNIT) CAPS capsule Take 1 capsule (50,000 Units total) by mouth every 7 (seven) days.   empagliflozin (JARDIANCE) 10 MG TABS tablet TAKE 1 TABLET BY MOUTH EVERY DAY BEFORE BREAKFAST (NOT TAKING)   Lancet Device MISC 1 each by Does not apply route in the morning, at noon, and at bedtime. May substitute to any manufacturer covered by patient's insurance.   Lancets Misc. MISC 1 each by Does not apply route in the morning, at noon, and at bedtime. May substitute to any manufacturer covered by patient's insurance.   No facility-administered medications prior to visit.  Review of Systems  Constitutional:  Negative for appetite change, chills, fatigue and fever.  Respiratory:  Negative for cough, chest tightness, shortness of breath and wheezing.   Cardiovascular:  Negative for chest pain and palpitations.  Gastrointestinal:  Negative for abdominal pain, nausea and vomiting.  Neurological:  Negative for dizziness and weakness.       Objective    BP (!) 144/72 (BP Location: Left Arm, Patient Position: Sitting, Cuff  Size: Normal)   Pulse 81   Resp 16   Ht 5\' 2"  (1.575 m)   Wt 139 lb (63 kg)   LMP 04/16/2015   SpO2 100%   BMI 25.42 kg/m   Physical Exam   General: Appearance:    Well developed, well nourished female in no acute distress  Eyes:    PERRL, conjunctiva/corneas clear, EOM's intact       Lungs:     Clear to auscultation bilaterally, respirations unlabored  Heart:    Normal heart rate. Normal rhythm. No murmurs, rubs, or gallops.    MS:   All extremities are intact.  1+ edema ankles, feet. Trace edema of fingers and hands. Dusky distal toes bilaterally. +3 pedal and radial pulses bilaterally. Cap refill < 1 secomd  Neurologic:   Awake, alert, oriented x 3. No apparent focal neurological defect.         Assessment & Plan    1. Edema, unspecified type (Primary) Improved since yesterday since she took an over the counter 'water pill' - Comprehensive metabolic panel - TSH - ANA Direct w/Reflex if Positive - CBC with Differential/Platelet  2. Type 2 diabetes mellitus with hyperglycemia, without long-term current use of insulin (HCC) Tolerating 2.5mg  Mounjaro much better than the 5. Remains off of Jardiance that she was taking before Greggory Keen was prescribed.    Follow up Dr. Payton Mccallum in April as scheduled.      Mila Merry, MD  [facility] [phone] (phone) [phone] (fax)  [facility]

## 2024-02-19 LAB — COMPREHENSIVE METABOLIC PANEL
ALT: 14 IU/L (ref 0–32)
AST: 16 IU/L (ref 0–40)
Albumin: 4.1 g/dL (ref 3.8–4.9)
Alkaline Phosphatase: 53 IU/L (ref 44–121)
BUN/Creatinine Ratio: 9 (ref 9–23)
BUN: 6 mg/dL (ref 6–24)
Bilirubin Total: <0.2 mg/dL (ref 0.0–1.2)
CO2: 23 mmol/L (ref 20–29)
Calcium: 9.4 mg/dL (ref 8.7–10.2)
Chloride: 108 mmol/L — ABNORMAL HIGH (ref 96–106)
Creatinine, Ser: 0.65 mg/dL (ref 0.57–1.00)
Globulin, Total: 2.1 g/dL (ref 1.5–4.5)
Glucose: 112 mg/dL — ABNORMAL HIGH (ref 70–99)
Potassium: 4.4 mmol/L (ref 3.5–5.2)
Sodium: 146 mmol/L — ABNORMAL HIGH (ref 134–144)
Total Protein: 6.2 g/dL (ref 6.0–8.5)
eGFR: 103 mL/min/{1.73_m2} (ref >59)

## 2024-02-19 LAB — CBC WITH DIFFERENTIAL/PLATELET
Basophils Absolute: 0 10*3/uL (ref 0.0–0.2)
Basos: 1 %
EOS (ABSOLUTE): 0 10*3/uL (ref 0.0–0.4)
Eos: 1 %
Hematocrit: 38.5 % (ref 34.0–46.6)
Hemoglobin: 13 g/dL (ref 11.1–15.9)
Immature Grans (Abs): 0 10*3/uL (ref 0.0–0.1)
Immature Granulocytes: 0 %
Lymphocytes Absolute: 2.1 10*3/uL (ref 0.7–3.1)
Lymphs: 26 %
MCH: 31 pg (ref 26.6–33.0)
MCHC: 33.8 g/dL (ref 31.5–35.7)
MCV: 92 fL (ref 79–97)
Monocytes Absolute: 0.5 10*3/uL (ref 0.1–0.9)
Monocytes: 6 %
Neutrophils Absolute: 5.4 10*3/uL (ref 1.4–7.0)
Neutrophils: 66 %
Platelets: 254 10*3/uL (ref 150–450)
RBC: 4.2 x10E6/uL (ref 3.77–5.28)
RDW: 13.9 % (ref 11.7–15.4)
WBC: 8.1 10*3/uL (ref 3.4–10.8)

## 2024-02-19 LAB — ANA W/REFLEX IF POSITIVE: Anti Nuclear Antibody (ANA): NEGATIVE

## 2024-02-19 LAB — TSH: TSH: 2.08 u[IU]/mL (ref 0.450–4.500)

## 2024-02-20 ENCOUNTER — Ambulatory Visit: Payer: Self-pay

## 2024-02-20 NOTE — Telephone Encounter (Signed)
NA. LMTCB 

## 2024-02-20 NOTE — Telephone Encounter (Signed)
 Chief Complaint: edema Symptoms: leg/ankle/feet/hands/facial edema same since OV on 02/18/24 Frequency: started Saturday 02/15/24 Pertinent Negatives: Patient denies SOB, tongue swelling, difficulty swallowing, fever. Disposition: [] ED /[] Urgent Care (no appt availability in office) / [] Appointment(In office/virtual)/ []  [facility]/ [] Home Care/ [] Refused Recommended Disposition /[] [facility] [facility]/ [x]  Follow-up with PCP Additional Notes: Patient triaged and seen in office on 02/18/24. She states she is calling back because she feels like the swelling is still there. She states this morning when she woke up, the swelling was improved and as she got up and started walking around the swelling came back. She states she feels like symptoms are the same as when seen on 02/18/24. She states she took a look at her labs and feels like they don't explain why she is having the swelling. Called [facility] and spoke with Okey Regal, advised sending over high priority note and  patient would like follow up regarding symptoms  and questions.  Copied from CRM [id number]. Topic: Clinical - Red Word Triage >> Feb 20, 2024  8:42 AM Gaetano Hawthorne wrote: Red Word that prompted transfer to Nurse Triage: Patient seen for swelling which started on started (see notes from triage) - the office did some labs but the patient didn't see anything that could explain the swelling - patient is still experiencing swelling today especially around the ankles, face and feet. Reason for Disposition  [1] Caller has URGENT question (includes prescribed medication questions) AND [2] triager unable to answer question  Answer Assessment - Initial Assessment Questions 1. MAIN CONCERN OR SYMPTOM:  "What is your main concern right now?" "What question do you have?" "What's the main symptom you're worried about?" (e.g., breathing difficulty, cough, fever. pain)     Lower legs/ankle/feet/hands/face swelling.  2. ONSET: "When did the  swelling   start?"     Saturday 02/15/24.  3. BETTER-SAME-WORSE: "Are you getting better, staying the same, or getting worse compared to how you felt at your last visit to the doctor (most recent medical visit)?"     She states she feels the same.  4. VISIT DATE: "When were you seen?" (Date)     02/18/24 with Dr Sherrie Mustache.  5. VISIT DOCTOR: "What is the name of the doctor taking care of you now?"     Dr Payton Mccallum is her PCP.  6. VISIT DIAGNOSIS:  "What was the main symptom or problem that you were seen for?" "Were you given a diagnosis?"      She states she was seen for the swelling. Diagnosis is edema, unspecified.  7. VISIT MEDICINES: "Did the doctor order any new medicines for you to use?" If Yes, ask: "Have you filled the prescription and started taking the medicine?"      No.  8. NEXT APPOINTMENT: "Have you scheduled a follow-up appointment with your doctor?"     03/09/24 with PCP.  9. PAIN: "Is there any pain?" If Yes, ask: "How bad is it?"  (Scale 0-10; or mild, moderate, severe)    - NONE (0): no pain    - MILD (1-3): doesn't interfere with normal activities     - MODERATE (4-7): interferes with normal activities or awakens from sleep     - SEVERE (8-10): excruciating pain, unable to do any normal activities     0/10. She states it was painful yesterday.  10. FEVER: "Do you have a fever?" If Yes, ask: "What is it, how was it measured  and when did it start?"  Denies.  11. OTHER SYMPTOMS: "Do you have any other symptoms?"       Denies.  Protocols used: Recent Medical Visit for Illness Follow-up Call-A-AH

## 2024-02-20 NOTE — Telephone Encounter (Signed)
 Labs are mostly normal. Sodium and chloride are slightly high. Not sure if this has anything to due with swelling. Recommend prescription for furosemide 20mg  once a day x 7 days. Please advise and sent prescription. (See 02/20/2024 telephone message)

## 2024-02-21 ENCOUNTER — Ambulatory Visit: Payer: Self-pay

## 2024-02-21 ENCOUNTER — Other Ambulatory Visit: Payer: Self-pay

## 2024-02-21 DIAGNOSIS — R609 Edema, unspecified: Secondary | ICD-10-CM

## 2024-02-21 MED ORDER — FUROSEMIDE 20 MG PO TABS: 20.0000 mg | ORAL_TABLET | Freq: Every day | ORAL | 0 refills | Status: DC

## 2024-02-21 NOTE — Telephone Encounter (Signed)
 Copied from CRM [id number]. Topic: Clinical - Red Word Triage >> Feb 21, 2024  8:08 AM Michelle Hendricks wrote: [facility] that prompted transfer to Nurse Triage: The patient has called to share that they are continuing to experience swelling in their legs and feet >> Feb 21, 2024 11:05 AM Michelle Bibles wrote: Michelle Hendricks is calling back because triage nurse told her to call back after 1 hour.   Patient called back as no rx was called in for her.  This RN called [facility] number and patient was transferred.

## 2024-02-21 NOTE — Telephone Encounter (Signed)
Please see yesterday telephone encounter.

## 2024-02-21 NOTE — Telephone Encounter (Signed)
  Chief Complaint: Medication question  Disposition: [] ED /[] Urgent Care (no appt availability in office) / [] Appointment(In office/virtual)/ []  [facility]/ [] Home Care/ [] Refused Recommended Disposition /[] [facility]/ [x]  Follow-up with PCP Additional Notes: Patient calls stating that there was no prescription called in to pharmacy- per note dated 3/20 "Recommend prescription for furosemide 20mg  once a day x 7 days." This RN attempted to contact [facility] x3, no answer. Patient requesting a call back when prescription is sent.    Copied from CRM [id number]. Topic: Clinical - Red Word Triage >> Feb 21, 2024  8:08 AM Everette C wrote: [facility] that prompted transfer to Nurse Triage: The patient has called to share that they are continuing to experience swelling in their legs and feet Reason for Disposition  [1] Caller has medicine question about med NOT prescribed by PCP AND [2] triager unable to answer question (e.g., compatibility with other med, storage)  Answer Assessment - Initial Assessment Questions 1. NAME of MEDICINE: "What medicine(s) are you calling about?"     Lasix 2. QUESTION: "What is your question?" (e.g., double dose of medicine, side effect)     Prescription was never sent to pharmacy 3. PRESCRIBER: "Who prescribed the medicine?" Reason: if prescribed by specialist, call should be referred to that group.     Dr. Sherrie Mustache 4. SYMPTOMS: "Do you have any symptoms?" If Yes, ask: "What symptoms are you having?"  "How bad are the symptoms (e.g., mild, moderate, severe)     Swelling- see note dated 02/20/24  Protocols used: Medication Question Call-A-AH

## 2024-02-21 NOTE — Telephone Encounter (Signed)
 Rx sent. Pt advised rx would be for 7 days and if she is still having concerns upon completion to contact the office. She verbalized understanding and rx sent to [facility] [address]

## 2024-02-29 ENCOUNTER — Other Ambulatory Visit: Payer: Self-pay | Admitting: Family Medicine

## 2024-02-29 DIAGNOSIS — E1165 Type 2 diabetes mellitus with hyperglycemia: Secondary | ICD-10-CM

## 2024-03-03 NOTE — Telephone Encounter (Signed)
 Requested by interface surescripts. Medication dose discontinued 02/18/24. Requested Prescriptions  Refused Prescriptions Disp Refills   MOUNJARO 5 MG/0.5ML Pen [Pharmacy Med Name: MOUNJARO 5 MG/0.5 ML PEN]      Sig: INJECT 5 MG SUBCUTANEOUSLY WEEKLY     Off-Protocol Failed - 03/03/2024  8:58 AM      Failed - Medication not assigned to a protocol, review manually.      Failed - Valid encounter within last 12 months    Recent Outpatient Visits           2 weeks ago Edema, unspecified type   [facility] Malva Limes, MD   3 weeks ago Type 2 diabetes mellitus with hyperglycemia, without long-term current use of insulin [facility])   [facility] [place], [place], PA-C       Future Appointments             In 6 days P[place], Monico Blitz, DO [facility]

## 2024-03-05 ENCOUNTER — Other Ambulatory Visit (HOSPITAL_COMMUNITY): Payer: Self-pay

## 2024-03-09 ENCOUNTER — Encounter: Payer: Self-pay | Admitting: Family Medicine

## 2024-03-09 ENCOUNTER — Ambulatory Visit: Payer: Self-pay | Admitting: Family Medicine

## 2024-03-09 VITALS — BP 113/66 | HR 120 | Temp 98.6°F | Ht 62.0 in | Wt 132.0 lb

## 2024-03-09 DIAGNOSIS — Z23 Encounter for immunization: Secondary | ICD-10-CM

## 2024-03-09 DIAGNOSIS — E1169 Type 2 diabetes mellitus with other specified complication: Secondary | ICD-10-CM | POA: Diagnosis not present

## 2024-03-09 DIAGNOSIS — N959 Unspecified menopausal and perimenopausal disorder: Secondary | ICD-10-CM | POA: Diagnosis not present

## 2024-03-09 DIAGNOSIS — J302 Other seasonal allergic rhinitis: Secondary | ICD-10-CM

## 2024-03-09 DIAGNOSIS — Z720 Tobacco use: Secondary | ICD-10-CM

## 2024-03-09 DIAGNOSIS — R609 Edema, unspecified: Secondary | ICD-10-CM

## 2024-03-09 DIAGNOSIS — J3089 Other allergic rhinitis: Secondary | ICD-10-CM

## 2024-03-09 DIAGNOSIS — E785 Hyperlipidemia, unspecified: Secondary | ICD-10-CM

## 2024-03-09 DIAGNOSIS — E1165 Type 2 diabetes mellitus with hyperglycemia: Secondary | ICD-10-CM | POA: Diagnosis not present

## 2024-03-09 MED ORDER — TIRZEPATIDE 2.5 MG/0.5ML ~~LOC~~ SOAJ: 2.5000 mg | SUBCUTANEOUS | 0 refills | Status: DC

## 2024-03-09 NOTE — Progress Notes (Signed)
 Established patient visit   Patient: Michelle Hendricks   DOB: [birth year]   58 y.o. Female  MRN: [id number] Visit Date: 03/09/2024  Today's healthcare provider: Sherlyn Hay, DO   Chief Complaint  Patient presents with   Diabetes   Subjective    HPI Michelle Hendricks is a 58 year old female who presents with swelling and postmenopausal symptoms.  She experiences recurrent swelling, which she describes as 'trying to come back.' The swelling decreases with increased water intake and the use of diuretics. She takes an over-the-counter diuretic, Diurex, containing caffeine, two pills in the morning, and also uses, very infrequently, Lasix (furosemide) as prescribed. The swelling is similar to what she experienced during her menstrual periods.  She discusses postmenopausal symptoms, including changes in emotional state and temperature regulation. She notes that the 'hot stuff is going away' and she feels cold. She is unsure of the exact timing of her last menstrual period but confirms it has been years since she reached menopause. She is currently taking citalopram, which she believes helps with her symptoms.  She mentions a recent biometric screening from her workplace, which showed improved results compared to previous tests. She has been monitoring her blood sugar levels, noting an average around 130 mg/dL, with higher readings in the mornings. She previously stopped her Jardiance and reduced her Mounjaro (tirzepitide) dose from 5 mg to 2.5 mg due to side effects, including nausea, which improved after adjusting her dose and eating habits (specifically: she now avoids skipping meals).  She experiences allergy symptoms and takes Zyrtec (cetirizine) over-the-counter. She has not used inhalers and does not feel short of breath except when walking around recently, which she attributes to pollen. She states she has been trying to clear her throat for weeks.  She continues to smoke and is not ready to  quit. She drinks mostly water and occasionally sugar-free Gatorade or diet sodas.  She has not received the most recent COVID-19 booster but has had the initial series and a booster. She is considering other vaccinations, including shingles and pneumonia.     Medications: Outpatient Medications Prior to Visit  Medication Sig   Blood Glucose Monitoring Suppl DEVI 1 each by Does not apply route in the morning, at noon, and at bedtime. May substitute to any manufacturer covered by patient's insurance.   Choline Fenofibrate (FENOFIBRIC ACID) 135 MG CPDR Take 1 capsule by mouth daily.   citalopram (CELEXA) 40 MG tablet TAKE 1 TABLET BY MOUTH EVERY DAY   furosemide (LASIX) 20 MG tablet Take 1 tablet (20 mg total) by mouth daily.   Glucose Blood (BLOOD GLUCOSE TEST STRIPS) STRP 1 each by In Vitro route in the morning, at noon, and at bedtime. May substitute to any manufacturer covered by patient's insurance.   lamoTRIgine (LAMICTAL) 25 MG tablet Take 2 tablets (50 mg total) by mouth daily.   Lancet Device MISC 1 each by Does not apply route in the morning, at noon, and at bedtime. May substitute to any manufacturer covered by patient's insurance.   Lancets Misc. MISC 1 each by Does not apply route in the morning, at noon, and at bedtime. May substitute to any manufacturer covered by patient's insurance.   levocetirizine (XYZAL) 5 MG tablet Take 1 tablet (5 mg total) by mouth every evening.   lisinopril (ZESTRIL) 20 MG tablet Take 1 tablet (20 mg total) by mouth daily.   pantoprazole (PROTONIX) 40 MG tablet TAKE 1 TABLET BY MOUTH  EVERY DAY   simvastatin (ZOCOR) 80 MG tablet Take 1 tablet (80 mg total) by mouth daily.   Vitamin D, Ergocalciferol, (DRISDOL) 1.25 MG (50000 UNIT) CAPS capsule Take 1 capsule (50,000 Units total) by mouth every 7 (seven) days.   [DISCONTINUED] tirzepatide [facility]) 2.5 MG/0.5ML Pen Inject 2.5 mg into the skin once a week.   No facility-administered medications prior to  visit.        Objective    BP 113/66 (BP Location: Left Arm, Patient Position: Sitting, Cuff Size: Normal)   Pulse (!) 120   Temp 98.6 F (37 C) (Oral)   Ht 5\' 2"  (1.575 m)   Wt 132 lb (59.9 kg)   LMP 04/16/2015   SpO2 97%   BMI 24.14 kg/m     Physical Exam Constitutional:      Appearance: Normal appearance.  HENT:     Head: Normocephalic and atraumatic.  Eyes:     General: No scleral icterus.    Extraocular Movements: Extraocular movements intact.     Conjunctiva/sclera: Conjunctivae normal.  Cardiovascular:     Rate and Rhythm: Normal rate and regular rhythm.     Pulses: Normal pulses.     Heart sounds: Normal heart sounds.  Pulmonary:     Effort: Pulmonary effort is normal. No respiratory distress.     Breath sounds: Normal breath sounds.  Musculoskeletal:     Right lower leg: No edema.     Left lower leg: No edema.  Skin:    General: Skin is warm and dry.  Neurological:     Mental Status: She is alert and oriented to person, place, and time. Mental status is at baseline.  Psychiatric:        Mood and Affect: Mood normal.        Behavior: Behavior normal.      No results found for any visits on 03/09/24.  Assessment & Plan    Type 2 diabetes mellitus with hyperglycemia, without long-term current use of insulin (HCC) -     Hemoglobin A1c -     Tirzepatide; Inject 2.5 mg into the skin once a week.  Dispense: 6 mL; Refill: 0  Hyperlipidemia associated with type 2 diabetes mellitus (HCC) -     Lipid panel  Edema, unspecified type  Postmenopausal symptoms  Tobacco use  Perennial allergic rhinitis with seasonal variation  Need for shingles vaccine -     Varicella-zoster vaccine IM  Encounter for Prevnar pneumococcal vaccination -     Pneumococcal conjugate vaccine 20-valent  Need for Tdap vaccination -     Tdap vaccine greater than or equal to 7yo IM     Type 2 Diabetes Mellitus with associated hyperlipidemia Reduced tirzepatide dose due to  nausea. Stopped empagliflozin. Average blood glucose around 130 mg/dL. Adjusted diet and tirzepatide dose to prevent nausea. - Continue tirzepatide 2.5 mg. - Monitor blood glucose levels. - Adjust diet to prevent nausea and ensure adequate nutrition.  Edema Recurrent swelling, potentially linked to postmenopausal symptoms, managed with increased water intake and diuretics. Advised of mildly-high salt content in Gatorade. - Continue Diurex as needed. - Continue to ensure adequate fluid intake. - Monitor swelling and adjust diuretic use accordingly. - Follow up on next visit if persisting or worsening  Postmenopausal Symptoms Experiencing emotional changes and swelling. Citalopram and behavioral health support help manage symptoms. - Continue citalopram for mood stabilization. - Continue behavioral health support.  Tobacco Use Disorder Continues to smoke, not ready to quit. Acknowledges current  situation. - Discuss smoking cessation options when she is ready.  Allergic Rhinitis Experiences allergy symptoms, taking cetirizine. Previously prescribed Xyzal as a stronger alternative. - Use Xyzal as needed for allergy symptoms. - Consider Allegra if Xyzal is not effective.  General Health Maintenance Agreed to receive shingles, pneumonia, and Tdap vaccines. Informed of potential flu-like symptoms post-vaccination. - Administer shingles vaccine. - Administer pneumonia vaccine. - Administer Tdap vaccine.   Return in about 4 months (around 07/09/2024) for DM.      I discussed the assessment and treatment plan with the patient  The patient was provided an opportunity to ask questions and all were answered. The patient agreed with the plan and demonstrated an understanding of the instructions.   The patient was advised to call back or seek an in-person evaluation if the symptoms worsen or if the condition fails to improve as anticipated.    Sherlyn Hay, DO  [facility] [phone] (phone) [phone] (fax)  [facility]

## 2024-03-11 LAB — LIPID PANEL
Chol/HDL Ratio: 3.2 ratio (ref 0.0–4.4)
Cholesterol, Total: 115 mg/dL (ref 100–199)
HDL: 36 mg/dL — ABNORMAL LOW (ref >39)
LDL Chol Calc (NIH): 37 mg/dL (ref 0–99)
Triglycerides: 274 mg/dL — ABNORMAL HIGH (ref 0–149)
VLDL Cholesterol Cal: 42 mg/dL — ABNORMAL HIGH (ref 5–40)

## 2024-03-11 LAB — HEMOGLOBIN A1C
Est. average glucose Bld gHb Est-mCnc: 131 mg/dL
Hgb A1c MFr Bld: 6.2 % — ABNORMAL HIGH (ref 4.8–5.6)

## 2024-03-19 ENCOUNTER — Encounter: Payer: Self-pay | Admitting: Family Medicine

## 2024-04-15 LAB — HM DIABETES EYE EXAM

## 2024-05-07 ENCOUNTER — Other Ambulatory Visit: Payer: Self-pay | Admitting: Family Medicine

## 2024-05-07 DIAGNOSIS — E1169 Type 2 diabetes mellitus with other specified complication: Secondary | ICD-10-CM

## 2024-05-19 ENCOUNTER — Ambulatory Visit (HOSPITAL_COMMUNITY): Admitting: Psychiatry

## 2024-06-09 ENCOUNTER — Emergency Department
Admission: EM | Admit: 2024-06-09 | Discharge: 2024-06-09 | Disposition: A | Discharge status: Home / Self Care | Attending: Emergency Medicine | Admitting: Emergency Medicine

## 2024-06-09 DIAGNOSIS — D72829 Elevated white blood cell count, unspecified: Secondary | ICD-10-CM | POA: Insufficient documentation

## 2024-06-09 DIAGNOSIS — T458X5A Adverse effect of other primarily systemic and hematological agents, initial encounter: Secondary | ICD-10-CM | POA: Diagnosis not present

## 2024-06-09 DIAGNOSIS — E119 Type 2 diabetes mellitus without complications: Secondary | ICD-10-CM | POA: Insufficient documentation

## 2024-06-09 DIAGNOSIS — T50905A Adverse effect of unspecified drugs, medicaments and biological substances, initial encounter: Secondary | ICD-10-CM

## 2024-06-09 DIAGNOSIS — R112 Nausea with vomiting, unspecified: Secondary | ICD-10-CM | POA: Insufficient documentation

## 2024-06-09 LAB — COMPREHENSIVE METABOLIC PANEL WITH GFR
ALT: 19 U/L (ref 0–44)
AST: 25 U/L (ref 15–41)
Albumin: 4.5 g/dL (ref 3.5–5.0)
Alkaline Phosphatase: 47 U/L (ref 38–126)
Anion gap: 13 (ref 5–15)
BUN: 23 mg/dL — ABNORMAL HIGH (ref 6–20)
CO2: 28 mmol/L (ref 22–32)
Calcium: 9.9 mg/dL (ref 8.9–10.3)
Chloride: 93 mmol/L — ABNORMAL LOW (ref 98–111)
Creatinine, Ser: 0.77 mg/dL (ref 0.44–1.00)
GFR, Estimated: >60 mL/min (ref >60)
Glucose, Bld: 154 mg/dL — ABNORMAL HIGH (ref 70–99)
Potassium: 4.1 mmol/L (ref 3.5–5.1)
Sodium: 134 mmol/L — ABNORMAL LOW (ref 135–145)
Total Bilirubin: 0.7 mg/dL (ref 0.0–1.2)
Total Protein: 7.9 g/dL (ref 6.5–8.1)

## 2024-06-09 LAB — CBC
HCT: 42.2 % (ref 36.0–46.0)
Hemoglobin: 15.4 g/dL — ABNORMAL HIGH (ref 12.0–15.0)
MCH: 31.8 pg (ref 26.0–34.0)
MCHC: 36.5 g/dL — ABNORMAL HIGH (ref 30.0–36.0)
MCV: 87 fL (ref 80.0–100.0)
Platelets: 310 K/uL (ref 150–400)
RBC: 4.85 MIL/uL (ref 3.87–5.11)
RDW: 11.1 % — ABNORMAL LOW (ref 11.5–15.5)
WBC: 15.5 K/uL — ABNORMAL HIGH (ref 4.0–10.5)
nRBC: 0 % (ref 0.0–0.2)

## 2024-06-09 LAB — LIPASE, BLOOD: Lipase: 36 U/L (ref 11–51)

## 2024-06-09 MED ORDER — METOCLOPRAMIDE HCL 10 MG PO TABS: 10.0000 mg | ORAL_TABLET | Freq: Three times a day (TID) | ORAL | 1 refills | Status: AC

## 2024-06-09 MED ORDER — METOCLOPRAMIDE HCL 5 MG/ML IJ SOLN
10.0000 mg | INTRAMUSCULAR | Status: AC
  Administered 2024-06-09: 10 mg via INTRAVENOUS
  Filled 2024-06-09: qty 2

## 2024-06-09 MED ORDER — ONDANSETRON HCL 4 MG/2ML IJ SOLN: 4.0000 mg | INTRAMUSCULAR | Status: DC

## 2024-06-09 MED ORDER — DIPHENHYDRAMINE HCL 50 MG/ML IJ SOLN
12.5000 mg | INTRAMUSCULAR | Status: AC
  Administered 2024-06-09: 12.5 mg via INTRAVENOUS
  Filled 2024-06-09: qty 1

## 2024-06-09 MED ORDER — PROMETHAZINE HCL 25 MG RE SUPP: 25.0000 mg | Freq: Four times a day (QID) | RECTAL | 0 refills | Status: AC | PRN

## 2024-06-09 MED ORDER — LACTATED RINGERS IV BOLUS
1000.0000 mL | Freq: Once | INTRAVENOUS | Status: AC
  Administered 2024-06-09: 1000 mL via INTRAVENOUS

## 2024-06-09 NOTE — ED Provider Notes (Signed)
 [facility] Provider Note    Event Date/Time   First MD Initiated Contact with Patient 06/09/24 0825     (approximate)   History   Emesis   HPI Michelle Hendricks is a 58 y.o. female who presents for evaluation of about 3 days of nausea and vomiting and inability to take anything by mouth.  She reports that this has happened in the past after giving herself her Mounjaro  no injection.  Sometimes it is not as bad, but she gave herself her injection on Friday and ever since then she has been sick to her stomach.  She denies any pain although she said she was little bit sore after vomiting so much over the last several days.  No fever.  No chest pain or shortness of breath.  She takes the Mounjaro  for diabetes, not weight loss.     Physical Exam   Triage Vital Signs: ED Triage Vitals [06/09/24 0717]  Encounter Vitals Group     BP (!) 157/92     Girls Systolic BP Percentile      Girls Diastolic BP Percentile      Boys Systolic BP Percentile      Boys Diastolic BP Percentile      Pulse Rate (!) 106     Resp 18     Temp 98.7 F (37.1 C)     Temp Source Oral     SpO2 99 %     Weight 59 kg (130 lb)     Height 1.575 m (5' 2)     Head Circumference      Peak Flow      Pain Score 0     Pain Loc      Pain Education      Exclude from Growth Chart     Most recent vital signs: Vitals:   06/09/24 0717  BP: (!) 157/92  Pulse: (!) 106  Resp: 18  Temp: 98.7 F (37.1 C)  SpO2: 99%    General: Awake, no distress.  Appears tired but nontoxic and otherwise well.  Conversant and alert. CV:  Good peripheral perfusion.  Mild tachycardia, regular rhythm. Resp:  Normal effort. Speaking easily and comfortably, no accessory muscle usage nor intercostal retractions.   Abd:  No distention.  Soft.  No tenderness to palpation of the abdomen and no guarding.   ED Results / Procedures / Treatments   Labs (all labs ordered are listed, but only abnormal results are  displayed) Labs Reviewed  COMPREHENSIVE METABOLIC PANEL WITH GFR - Abnormal; Notable for the following components:      Result Value   Sodium 134 (*)    Chloride 93 (*)    Glucose, Bld 154 (*)    BUN 23 (*)    All other components within normal limits  CBC - Abnormal; Notable for the following components:   WBC 15.5 (*)    Hemoglobin 15.4 (*)    MCHC 36.5 (*)    RDW 11.1 (*)    All other components within normal limits  LIPASE, BLOOD     PROCEDURES:  Critical Care performed: No  Procedures    IMPRESSION / MDM / ASSESSMENT AND PLAN / ED COURSE  I reviewed the triage vital signs and the nursing notes.                              Differential diagnosis includes, but is not limited  to, medication side effect, SBO/ileus, dehydration, renal dysfunction, electrolyte abnormality.  Patient's presentation is most consistent with acute presentation with potential threat to life or bodily function.  Labs/studies ordered: CMP, lipase, CBC  Interventions/Medications given:  Medications  lactated ringers  bolus 1,000 mL (1,000 mLs Intravenous New Bag/Given 06/09/24 0855)  metoCLOPramide  (REGLAN ) injection 10 mg (10 mg Intravenous Given 06/09/24 0852)  diphenhydrAMINE  (BENADRYL ) injection 12.5 mg (12.5 mg Intravenous Given 06/09/24 0851)    (Note:  hospital course my include additional interventions and/or labs/studies not listed above.)   Despite patient's persistent vomiting, electrolytes are generally reassuring.  CBC shows leukocytosis but this is nonspecific in the setting of several days of vomiting.  Lipase is normal.  Patient has a benign abdomen with no tenderness to palpation.  I will treat with LR 1 L bolus, Reglan  10 mg IV, and Benadryl  12.5 mg IV to try and alleviate the persistent nausea.  She said that Zofran  has not worked as well in the past I think this will be a good combination for her.  If she can then tolerate oral intake she will be appropriate for discharge and  outpatient follow-up.  Patient agrees with the plan.     Clinical Course as of 06/09/24 0945  Tue Jun 09, 2024  0942 Reassessed patient, states she is feeling better and hungry.  Brought her soda and crackers, she passed PO challenge.  Appropriate for discharge and outpatient follow up.  Gave usual/customary return precautions [CF]    Clinical Course User Index [CF] Gordan Huxley, MD     FINAL CLINICAL IMPRESSION(S) / ED DIAGNOSES   Final diagnoses:  Nausea and vomiting, unspecified vomiting type  Medication side effect, initial encounter     Rx / DC Orders   ED Discharge Orders          Ordered    metoCLOPramide  (REGLAN ) 10 MG tablet  3 times daily with meals        06/09/24 0943    promethazine  (PHENERGAN ) 25 MG suppository  Every 6 hours PRN        06/09/24 0943             Note:  This document was prepared using Dragon voice recognition software and may include unintentional dictation errors.   Gordan Huxley, MD 06/09/24 [id number]

## 2024-06-09 NOTE — ED Triage Notes (Signed)
 Pt presents to the ED via POV from home for nausea/vomiting x3 days. Pt states that she has had this before from the mounjaro  shot. States that she has been taking them weekly for about six months. Reports last shot was Friday.

## 2024-06-09 NOTE — Discharge Instructions (Addendum)
 As we discussed, your labs were reassuring, and we agree you are most likely having a side effect from your Mounjaro .  Please follow-up with your primary care doctor to discuss of an alternative medication may be better for you.  We sent 2 prescriptions for nausea medicine to the pharmacy for you; hopefully the metoclopramide  (Reglan ) will help, but you will have Phenergan  suppositories available to use as well.  Follow-up at the next available opportunity and return to the emergency department if you develop new or worsening symptoms that concern you.

## 2024-06-10 ENCOUNTER — Telehealth (HOSPITAL_COMMUNITY): Payer: Self-pay | Admitting: *Deleted

## 2024-06-10 ENCOUNTER — Other Ambulatory Visit (HOSPITAL_COMMUNITY): Payer: Self-pay | Admitting: *Deleted

## 2024-06-10 MED ORDER — LAMOTRIGINE 25 MG PO TABS: 50.0000 mg | ORAL_TABLET | Freq: Every day | ORAL | 0 refills | Status: DC

## 2024-06-10 NOTE — Telephone Encounter (Signed)
 [id number] [address], [place] [phone]    lamoTRIgine  (LAMICTAL ) 25 MG tablet  LAST FILL:: 03-12-24

## 2024-06-10 NOTE — Telephone Encounter (Signed)
 PROVIDER AUTHORIZED REFILL  lamoTRIgine  (LAMICTAL ) 25 MG tablet  LAST FILL:: 03-12-24  SENT CO SIGN

## 2024-06-10 NOTE — Telephone Encounter (Signed)
 Is Dr Melody out?

## 2024-06-16 ENCOUNTER — Ambulatory Visit (HOSPITAL_COMMUNITY): Admitting: Psychiatry

## 2024-06-16 ENCOUNTER — Telehealth (HOSPITAL_COMMUNITY): Payer: Self-pay | Admitting: *Deleted

## 2024-06-16 ENCOUNTER — Encounter (HOSPITAL_COMMUNITY): Payer: Self-pay | Admitting: Psychiatry

## 2024-06-16 VITALS — BP 136/80 | HR 78 | Ht 62.0 in | Wt 128.0 lb

## 2024-06-16 DIAGNOSIS — F411 Generalized anxiety disorder: Secondary | ICD-10-CM

## 2024-06-16 DIAGNOSIS — F32A Depression, unspecified: Secondary | ICD-10-CM | POA: Diagnosis not present

## 2024-06-16 DIAGNOSIS — F419 Anxiety disorder, unspecified: Secondary | ICD-10-CM

## 2024-06-16 DIAGNOSIS — R4589 Other symptoms and signs involving emotional state: Secondary | ICD-10-CM | POA: Diagnosis not present

## 2024-06-16 MED ORDER — CITALOPRAM HYDROBROMIDE 40 MG PO TABS: ORAL_TABLET | ORAL | 0 refills | Status: DC

## 2024-06-16 NOTE — Telephone Encounter (Signed)
 [id number] [address], [place] [address]   citalopram  (CELEXA ) 40 MG tablet   NEXT APPT  06/19/24 LAST APPT  02/06/24

## 2024-06-16 NOTE — Progress Notes (Signed)
 [facility] Follow up visit Patient Identification: Michelle Hendricks MRN:  [id number] Date of Evaluation:  06/16/2024 Referral Source: primary care Chief Complaint:   Chief Complaint  Patient presents with   Follow-up  Follow up  anxiety Visit Diagnosis:    ICD-10-CM   1. Depression, unspecified depression type  F32.A citalopram  (CELEXA ) 40 MG tablet    2. GAD (generalized anxiety disorder)  F41.1     3. Moodiness  R45.89     4. Anxiety  F41.9 citalopram  (CELEXA ) 40 MG tablet         History of Present Illness: Patient is a 58 years old currently married Caucasian female initially  referred by primary care physician office to establish care for depression and anxiety. She lives with her husband and also her mom is living she works full-time with a [employer] development [employer].  Her husband works  as well  On eval doing fair, going thru weight loss med with diabetes but had  nausea so may change that  Overall depression manageable . Has some hot flashes so going thru pre menopausal evaluation  Job stress manageable , not needing buspar     Husband does not work    Aggravating factors; finances    Her mom is also living with her.  Modifying factors : dog,grand kids  Severity getting stressed over taxes payments Duration significant history when younger for PTSD now is more so for moodiness and depression  Past Psychiatric History: depression, anxiety, ptsd  Previous Psychotropic Medications: Yes   Substance Abuse History in the last 12 months:  Yes.    Consequences of Substance Abuse: Discussed effects of THC on mood , amotivation  Past Medical History:  Past Medical History:  Diagnosis Date   Anxiety    Depression    Diabetes mellitus without complication (HCC)    Hypertension     Past Surgical History:  Procedure Laterality Date   NASAL SINUS SURGERY      Family Psychiatric History: dad : moodiness  Family History:  Family History  Problem Relation Age of Onset   Alzheimer's  disease Maternal Grandmother    Cancer Maternal Grandfather    Diabetes Maternal Grandfather    Heart attack Father     Social History:   Social History   Socioeconomic History   Marital status: Married    Spouse name: Not on file   Number of children: Not on file   Years of education: Not on file   Highest education level: Not on file  Occupational History   Not on file  Tobacco Use   Smoking status: Some Days    Current packs/day: 0.75    Average packs/day: 0.8 packs/day for 20.0 years (15.0 ttl pk-yrs)    Types: Cigarettes   Smokeless tobacco: Never   Tobacco comments:    Has cut back to 1/2 pack a day.   Substance and Sexual Activity   Alcohol use: Yes    Alcohol/week: 2.0 standard drinks of alcohol    Types: 2 Shots of liquor per week   Drug use: Never   Sexual activity: Yes    Partners: Male    Birth control/protection: None  Other Topics Concern   Not on file  Social History Narrative   Not on file   Social Drivers of Health   Financial Resource Strain: Not on file  Food Insecurity: Not on file  Transportation Needs: Not on file  Physical Activity: Not on file  Stress: Not on file (10/09/2023)  Social  Connections: Not on file    Allergies:   Allergies  Allergen Reactions   Penicillins Rash and Other (See Comments)    Has patient had a PCN reaction causing immediate rash, facial/tongue/throat swelling, SOB or lightheadedness with hypotension: No Has patient had a PCN reaction causing severe rash involving mucus membranes or skin necrosis: No Has patient had a PCN reaction that required hospitalization No Has patient had a PCN reaction occurring within the last 10 years: No If all of the above answers are NO, then may proceed with Cephalosporin use.    Metabolic Disorder Labs: Lab Results  Component Value Date   HGBA1C 6.2 (H) 03/10/2024   MPG 231.69 07/09/2022   No results found for: PROLACTIN Lab Results  Component Value Date   CHOL 115  03/10/2024   TRIG 274 (H) 03/10/2024   HDL 36 (L) 03/10/2024   CHOLHDL 3.2 03/10/2024   LDLCALC 37 03/10/2024   LDLCALC Comment (A) 11/11/2023   Lab Results  Component Value Date   TSH 2.080 02/18/2024    Therapeutic Level Labs: No results found for: LITHIUM No results found for: CBMZ No results found for: VALPROATE  Current Medications: Current Outpatient Medications  Medication Sig Dispense Refill   Blood Glucose Monitoring Suppl DEVI 1 each by Does not apply route in the morning, at noon, and at bedtime. May substitute to any manufacturer covered by patient's insurance. 1 each 0   Choline Fenofibrate (FENOFIBRIC ACID) 135 MG CPDR Take 1 capsule by mouth daily. 90 capsule 1   citalopram  (CELEXA ) 40 MG tablet TAKE 1 TABLET BY MOUTH EVERY DAY 90 tablet 0   furosemide  (LASIX ) 20 MG tablet Take 1 tablet (20 mg total) by mouth daily. 7 tablet 0   Glucose Blood (BLOOD GLUCOSE TEST STRIPS) STRP 1 each by In Vitro route in the morning, at noon, and at bedtime. May substitute to any manufacturer covered by patient's insurance. 100 strip 2   lamoTRIgine  (LAMICTAL ) 25 MG tablet Take 2 tablets (50 mg total) by mouth daily. 180 tablet 0   Lancet Device MISC 1 each by Does not apply route in the morning, at noon, and at bedtime. May substitute to any manufacturer covered by patient's insurance. 1 each 0   Lancets Misc. MISC 1 each by Does not apply route in the morning, at noon, and at bedtime. May substitute to any manufacturer covered by patient's insurance. 100 each 2   levocetirizine (XYZAL ) 5 MG tablet Take 1 tablet (5 mg total) by mouth every evening. 30 tablet 11   lisinopril  (ZESTRIL ) 20 MG tablet Take 1 tablet (20 mg total) by mouth daily. 90 tablet 3   metoCLOPramide  (REGLAN ) 10 MG tablet Take 1 tablet (10 mg total) by mouth 3 (three) times daily with meals. 90 tablet 1   pantoprazole  (PROTONIX ) 40 MG tablet TAKE 1 TABLET BY MOUTH EVERY DAY 90 tablet 1   promethazine  (PHENERGAN )  25 MG suppository Place 1 suppository (25 mg total) rectally every 6 (six) hours as needed for nausea or vomiting. 12 each 0   simvastatin  (ZOCOR ) 80 MG tablet TAKE 1 TABLET BY MOUTH EVERY DAY 90 tablet 1   tirzepatide  (MOUNJARO ) 2.5 MG/0.5ML Pen Inject 2.5 mg into the skin once a week. 6 mL 0   Vitamin D , Ergocalciferol , (DRISDOL ) 1.25 MG (50000 UNIT) CAPS capsule Take 1 capsule (50,000 Units total) by mouth every 7 (seven) days. 12 capsule 1   No current facility-administered medications for this visit.  Psychiatric Specialty Exam: Review of Systems  Cardiovascular:  Negative for chest pain.  Neurological:  Negative for tremors.  Psychiatric/Behavioral:  Negative for self-injury.     Blood pressure 136/80, pulse 78, height 5' 2 (1.575 m), weight 128 lb (58.1 kg), last menstrual period 04/16/2015.Body mass index is 23.41 kg/m.  General Appearance: Casual  Eye Contact:  Fair  Speech:  Clear and Coherent  Volume:  Normal  Mood: fair  Affect:  Congruent  Thought Process:  Goal Directed  Orientation:  Full (Time, Place, and Person)  Thought Content:  Rumination  Suicidal Thoughts:  No  Homicidal Thoughts:  No  Memory:  Immediate;   Fair  Judgement:  Fair  Insight:  Shallow  Psychomotor Activity:  Decreased  Concentration:  Concentration: Fair and Attention Span: Fair  Recall:  [employer] of Knowledge:Good  Language: Good  Akathisia:  No  Handed:    AIMS (if indicated):  not done  Assets:  Communication Skills Desire for Improvement Housing  ADL's:  Intact  Cognition: WNL  Sleep:  Fair   Screenings: GAD-7    Garment/textile technologist Visit from 02/10/2024 in [facility] Office Visit from 11/08/2023 in [facility] Office Visit from 03/13/2022 in [facility] Office Visit from 10/07/2020 in [facility]  Total GAD-7 Score 7 9 13 6    PHQ2-9    Flowsheet Row Office Visit  from 02/18/2024 in [facility] Office Visit from 02/10/2024 in [facility] Office Visit from 11/08/2023 in [facility] Office Visit from 05/14/2023 in [facility] Office Visit from 08/24/2022 in [facility]  PHQ-2 Total Score 2 2 2 2 3   PHQ-9 Total Score 7 11 8 10 12    Flowsheet Row ED from 06/09/2024 in [facility] Emergency Department at [facility] ED from 12/05/2023 in [facility] Emergency Department at [facility] Visit from 11/13/2022 in [facility]  C-SSRS RISK CATEGORY No Risk No Risk No Risk    Assessment and Plan: as follows  Prior documentation reviewed  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

## 2024-06-24 ENCOUNTER — Other Ambulatory Visit: Payer: Self-pay | Admitting: Family Medicine

## 2024-06-24 DIAGNOSIS — E1165 Type 2 diabetes mellitus with hyperglycemia: Secondary | ICD-10-CM

## 2024-07-01 ENCOUNTER — Encounter: Payer: Self-pay | Admitting: Family Medicine

## 2024-07-01 DIAGNOSIS — E1165 Type 2 diabetes mellitus with hyperglycemia: Secondary | ICD-10-CM

## 2024-07-02 ENCOUNTER — Other Ambulatory Visit: Payer: Self-pay | Admitting: Family Medicine

## 2024-07-02 DIAGNOSIS — E1165 Type 2 diabetes mellitus with hyperglycemia: Secondary | ICD-10-CM

## 2024-07-02 MED ORDER — OZEMPIC (0.25 OR 0.5 MG/DOSE) 2 MG/3ML ~~LOC~~ SOPN: PEN_INJECTOR | SUBCUTANEOUS | 2 refills | Status: DC

## 2024-07-06 ENCOUNTER — Other Ambulatory Visit (HOSPITAL_COMMUNITY): Payer: Self-pay

## 2024-07-06 ENCOUNTER — Telehealth: Payer: Self-pay

## 2024-07-06 NOTE — Telephone Encounter (Signed)
 PA request has been Submitted. New Encounter has been or will be created for follow up. For additional info see Pharmacy Prior Auth telephone encounter from 07/06/2024.

## 2024-07-06 NOTE — Telephone Encounter (Signed)
 Pharmacy Patient Advocate Encounter   Received notification from Physician's Office that prior authorization for Ozempic  (0.25 or 0.5 MG/DOSE) 2MG /3ML pen-injectors is required/requested.   Insurance verification completed.   The patient is insured through [employer] .   Per test claim: PA required; PA submitted to above mentioned insurance via CoverMyMeds Key/confirmation #/EOC [id number] Status is pending

## 2024-07-07 ENCOUNTER — Other Ambulatory Visit (HOSPITAL_COMMUNITY): Payer: Self-pay

## 2024-07-07 NOTE — Telephone Encounter (Signed)
 Hello team,   Are there any updates on the PA for this pt?

## 2024-07-07 NOTE — Telephone Encounter (Addendum)
 Pharmacy Patient Advocate Encounter  Received notification from [employer] that Prior Authorization for Ozempic  2mg  has been APPROVED from 07/06/24 to 07/06/25. Ran test claim, Copay is $24.99. This test claim was processed through [facility]- copay amounts may vary at other pharmacies due to pharmacy/plan contracts, or as the patient moves through the different stages of their insurance plan. Pt may now fill this at their pharmacy.  PA #/Case ID/Reference #:  [id number]

## 2024-07-10 ENCOUNTER — Encounter: Payer: Self-pay | Admitting: Family Medicine

## 2024-07-10 ENCOUNTER — Ambulatory Visit: Admitting: Family Medicine

## 2024-07-10 VITALS — BP 109/57 | HR 98 | Temp 99.2°F | Resp 16 | Wt 132.2 lb

## 2024-07-10 DIAGNOSIS — B9689 Other specified bacterial agents as the cause of diseases classified elsewhere: Secondary | ICD-10-CM

## 2024-07-10 DIAGNOSIS — J019 Acute sinusitis, unspecified: Secondary | ICD-10-CM

## 2024-07-10 DIAGNOSIS — R509 Fever, unspecified: Secondary | ICD-10-CM

## 2024-07-10 DIAGNOSIS — J069 Acute upper respiratory infection, unspecified: Secondary | ICD-10-CM

## 2024-07-10 DIAGNOSIS — Z716 Tobacco abuse counseling: Secondary | ICD-10-CM

## 2024-07-10 DIAGNOSIS — B379 Candidiasis, unspecified: Secondary | ICD-10-CM

## 2024-07-10 DIAGNOSIS — E1165 Type 2 diabetes mellitus with hyperglycemia: Secondary | ICD-10-CM | POA: Diagnosis not present

## 2024-07-10 DIAGNOSIS — T3695XA Adverse effect of unspecified systemic antibiotic, initial encounter: Secondary | ICD-10-CM

## 2024-07-10 DIAGNOSIS — F17200 Nicotine dependence, unspecified, uncomplicated: Secondary | ICD-10-CM

## 2024-07-10 DIAGNOSIS — K649 Unspecified hemorrhoids: Secondary | ICD-10-CM

## 2024-07-10 LAB — POC COVID19/FLU A&B COMBO
Covid Antigen, POC: NEGATIVE
Influenza A Antigen, POC: NEGATIVE
Influenza B Antigen, POC: NEGATIVE

## 2024-07-10 LAB — POCT GLYCOSYLATED HEMOGLOBIN (HGB A1C)
Est. average glucose Bld gHb Est-mCnc: 128
Hemoglobin A1C: 6.1 % — AB (ref 4.0–5.6)

## 2024-07-10 MED ORDER — FLUCONAZOLE 150 MG PO TABS: 150.0000 mg | ORAL_TABLET | Freq: Once | ORAL | 0 refills | Status: AC

## 2024-07-10 MED ORDER — HYDROCORTISONE (PERIANAL) 2.5 % EX CREA: 1.0000 | TOPICAL_CREAM | Freq: Two times a day (BID) | CUTANEOUS | 0 refills | Status: AC | PRN

## 2024-07-10 MED ORDER — AZITHROMYCIN 250 MG PO TABS: ORAL_TABLET | ORAL | 0 refills | Status: AC

## 2024-07-10 NOTE — Progress Notes (Signed)
 Established patient visit   Patient: Michelle Hendricks   DOB: Jul 22, 1966   58 y.o. Female  MRN: [id number] Visit Date: 07/10/2024  Today's healthcare provider: LAURAINE LOISE BUOY, DO   Chief Complaint  Patient presents with   Medical Management of Chronic Issues    Follow-up DM   Subjective    HPI Michelle Hendricks is a 58 year old female with type 2 diabetes who presents for management of her diabetes and sinus symptoms.  She is managing her type 2 diabetes with semaglutide  (Ozempic ) after previously using tirzepatide  (Mounjaro ). Her A1c is 6.1, which is likely attributable to Mounjaro  as she has only recently started Ozempic . She experienced significant nausea and diarrhea with Mounjaro , leading to two emergency room visits, one of which occurred while she was in [place] . She switched to Ozempic  due to insurance issues and is on the initial dose.   She is no longer taking pantoprazole  and has not experienced reflux symptoms. She is not taking Xyzal  currently due to a lack of allergy symptoms.  She reports sinus symptoms including low-grade fever, watery eyes, ear pain, and nasal drainage for about a week. She has a history of sinus surgery and notes that any illness tends to affect her sinuses. She experiences lightheadedness upon standing and has noted lower than usual blood pressure readings. She describes sinus pressure and itching, with sneezing and nasal voice changes due to drainage. She has been using Claritin during the day for symptom relief and reports coughing, especially when lying down, which disrupts her sleep.  She is currently taking over-the-counter vitamin D , unsure of the exact type or dosage, but mentions taking D2 daily. She has completed a previous bottle of prescribed vitamin D  but finds it easier to remember daily over-the-counter supplements.  She smokes approximately three-quarters of a pack per day, having started in her mid-thirties, and has been smoking for about 20  years. She expresses that her job contributes to her continued smoking.  She reports experiencing hemorrhoids, with one being particularly painful. She has tried over-the-counter creams and ointments without relief.       Medications: Outpatient Medications Prior to Visit  Medication Sig   citalopram  (CELEXA ) 40 MG tablet TAKE 1 TABLET BY MOUTH EVERY DAY   Glucose Blood (BLOOD GLUCOSE TEST STRIPS) STRP 1 each by In Vitro route in the morning, at noon, and at bedtime. May substitute to any manufacturer covered by patient's insurance.   lamoTRIgine  (LAMICTAL ) 25 MG tablet Take 2 tablets (50 mg total) by mouth daily.   lisinopril  (ZESTRIL ) 20 MG tablet Take 1 tablet (20 mg total) by mouth daily.   metoCLOPramide  (REGLAN ) 10 MG tablet Take 1 tablet (10 mg total) by mouth 3 (three) times daily with meals.   promethazine  (PHENERGAN ) 25 MG suppository Place 1 suppository (25 mg total) rectally every 6 (six) hours as needed for nausea or vomiting.   Semaglutide ,0.25 or 0.5MG /DOS, (OZEMPIC , 0.25 OR 0.5 MG/DOSE,) 2 MG/3ML SOPN Inject 0.25 mg into the skin weekly for 4 weeks, then, if tolerating well, increase to 0.5 mg weekly   simvastatin  (ZOCOR ) 80 MG tablet TAKE 1 TABLET BY MOUTH EVERY DAY   Choline Fenofibrate (FENOFIBRIC ACID) 135 MG CPDR Take 1 capsule by mouth daily.   levocetirizine (XYZAL ) 5 MG tablet Take 1 tablet (5 mg total) by mouth every evening. (Patient not taking: Reported on 07/10/2024)   [DISCONTINUED] Blood Glucose Monitoring Suppl DEVI 1 each by Does not apply route in  the morning, at noon, and at bedtime. May substitute to any manufacturer covered by patient's insurance.   [DISCONTINUED] furosemide  (LASIX ) 20 MG tablet Take 1 tablet (20 mg total) by mouth daily. (Patient not taking: Reported on 07/10/2024)   [DISCONTINUED] Lancet Device MISC 1 each by Does not apply route in the morning, at noon, and at bedtime. May substitute to any manufacturer covered by patient's insurance.    [DISCONTINUED] Lancets Misc. MISC 1 each by Does not apply route in the morning, at noon, and at bedtime. May substitute to any manufacturer covered by patient's insurance.   [DISCONTINUED] pantoprazole  (PROTONIX ) 40 MG tablet TAKE 1 TABLET BY MOUTH EVERY DAY   [DISCONTINUED] Vitamin D , Ergocalciferol , (DRISDOL ) 1.25 MG (50000 UNIT) CAPS capsule Take 1 capsule (50,000 Units total) by mouth every 7 (seven) days.   No facility-administered medications prior to visit.    Review of Systems  Constitutional:  Negative for appetite change, chills, fatigue and fever.  HENT:  Positive for congestion, ear pain, postnasal drip, rhinorrhea, sinus pressure, sinus pain, sneezing and sore throat (mild, from drainage). Negative for trouble swallowing and voice change.   Respiratory:  Negative for chest tightness and shortness of breath.   Cardiovascular:  Negative for chest pain and palpitations.  Gastrointestinal:  Negative for abdominal pain, nausea and vomiting.  Neurological:  Negative for dizziness and weakness.        Objective    BP (!) 109/57 (BP Location: Left Arm, Patient Position: Sitting, Cuff Size: Normal)   Pulse 98   Temp 99.2 F (37.3 C) (Oral)   Resp 16   Wt 132 lb 3.2 oz (60 kg)   LMP 04/16/2015   SpO2 98%   BMI 24.18 kg/m     Physical Exam Vitals reviewed.  Constitutional:      General: She is not in acute distress.    Appearance: Normal appearance. She is well-developed. She is not diaphoretic.  HENT:     Head: Normocephalic and atraumatic.     Right Ear: Tympanic membrane, ear canal and external ear normal.     Left Ear: Tympanic membrane, ear canal and external ear normal.     Nose: Nose normal.     Mouth/Throat:     Mouth: Mucous membranes are moist.     Pharynx: Oropharynx is clear. No oropharyngeal exudate.  Eyes:     General: No scleral icterus.    Conjunctiva/sclera: Conjunctivae normal.     Pupils: Pupils are equal, round, and reactive to light.   Cardiovascular:     Rate and Rhythm: Normal rate.  Pulmonary:     Effort: Pulmonary effort is normal.  Musculoskeletal:     Cervical back: Neck supple.     Right lower leg: No edema.     Left lower leg: No edema.  Lymphadenopathy:     Cervical: No cervical adenopathy.  Skin:    General: Skin is warm and dry.     Findings: No rash.  Neurological:     General: No focal deficit present.     Mental Status: She is alert and oriented to person, place, and time. Mental status is at baseline.      Results for orders placed or performed in visit on 07/10/24  POCT glycosylated hemoglobin (Hb A1C)  Result Value Ref Range   Hemoglobin A1C 6.1 (A) 4.0 - 5.6 %   Est. average glucose Bld gHb Est-mCnc 128   POC Covid19/Flu A&B Antigen  Result Value Ref Range   Influenza  A Antigen, POC Negative Negative   Influenza B Antigen, POC Negative Negative   Covid Antigen, POC Negative Negative    Assessment & Plan    Type 2 diabetes mellitus with hyperglycemia, without long-term current use of insulin  (HCC) -     POCT glycosylated hemoglobin (Hb A1C)  Fever, unspecified fever cause -     POC Covid19/Flu A&B Antigen  Upper respiratory infection, acute -     POC Covid19/Flu A&B Antigen  Acute bacterial sinusitis -     Azithromycin ; Take 2 tablets on day 1, then 1 tablet daily on days 2 through 5  Dispense: 6 tablet; Refill: 0  Antibiotic-induced yeast infection -     Fluconazole ; Take 1 tablet (150 mg total) by mouth once for 1 dose.  Dispense: 1 tablet; Refill: 0  Hemorrhoids, unspecified hemorrhoid type -     Hydrocortisone  (Perianal); Place 1 Application rectally 2 (two) times daily as needed for hemorrhoids or anal itching.  Dispense: 30 g; Refill: 0  Nicotine dependence with current use  Encounter for smoking cessation counseling      Type 2 diabetes mellitus Diabetes managed with semaglutide . Previous medication (Mounjaro ) caused significant side effects, even at the lowest  dose. Current A1c today is 6.1. Ozempic  tolerated well. - Continue semaglutide  (Ozempic ) and increase dose to 0.5 mg as tolerated. - Provide refills for Ozempic .  Acute sinusitis Symptoms suggest acute sinusitis. Advised to monitor symptoms before initiating antibiotics. - Prescribe azithromycin , to start if symptoms do not improve over the weekend. - Encourage hydration and rest. - Recommend Xyzal  for symptom relief. - Advise against simultaneous use of Xyzal  and Claritin.  Hemorrhoids Painful hemorrhoids not responsive to OTC treatments. Discussed suppositories and creams. - Prescribe hemorrhoid cream with applicator. - Advise use of cream twice daily as needed. - Hold simvastatin  if taking fluconazole .  Nicotine dependence, cigarettes; smoking cessation counseling Smoking approximately three-quarters of a pack per day for 20 years. Discussed job stress impact.  Patient prefers to taper her use on her own at this time.     Return in about 3 months (around 10/10/2024) for CPE, Chronic f/u.      I discussed the assessment and treatment plan with the patient  The patient was provided an opportunity to ask questions and all were answered. The patient agreed with the plan and demonstrated an understanding of the instructions.   The patient was advised to call back or seek an in-person evaluation if the symptoms worsen or if the condition fails to improve as anticipated.    LAURAINE LOISE BUOY, DO  [facility] [phone] (phone) [phone] (fax)  [facility]

## 2024-07-20 ENCOUNTER — Encounter: Payer: Self-pay | Admitting: Family Medicine

## 2024-09-09 ENCOUNTER — Other Ambulatory Visit (HOSPITAL_COMMUNITY): Payer: Self-pay | Admitting: Psychiatry

## 2024-09-13 ENCOUNTER — Other Ambulatory Visit (HOSPITAL_COMMUNITY): Payer: Self-pay | Admitting: Psychiatry

## 2024-09-13 DIAGNOSIS — F419 Anxiety disorder, unspecified: Secondary | ICD-10-CM

## 2024-09-13 DIAGNOSIS — F32A Depression, unspecified: Secondary | ICD-10-CM

## 2024-09-26 ENCOUNTER — Other Ambulatory Visit: Payer: Self-pay | Admitting: Family Medicine

## 2024-09-26 DIAGNOSIS — E1165 Type 2 diabetes mellitus with hyperglycemia: Secondary | ICD-10-CM

## 2024-09-28 NOTE — Telephone Encounter (Signed)
 Requested Prescriptions  Pending Prescriptions Disp Refills   Semaglutide ,0.25 or 0.5MG /DOS, (OZEMPIC , 0.25 OR 0.5 MG/DOSE,) 2 MG/3ML SOPN [Pharmacy Med Name: OZEMPIC  0.25-0.5 MG/DOSE PEN] 3 mL 2    Sig: INJECT 0.25 MG INTO THE SKIN WEEKLY FOR 4 WEEKS, THEN, IF TOLERATING WELL, INCREASE TO 0.5 MG WEEKLY     Endocrinology:  Diabetes - GLP-1 Receptor Agonists - semaglutide  Failed - 09/28/2024  4:10 PM      Failed - HBA1C in normal range and within 180 days    Hemoglobin A1C  Date Value Ref Range Status  07/10/2024 6.1 (A) 4.0 - 5.6 % Final  10/04/2017 8.2  Final    Comment:    followed by Dr. Damian    Hgb A1c MFr Bld  Date Value Ref Range Status  03/10/2024 6.2 (H) 4.8 - 5.6 % Final    Comment:             Prediabetes: 5.7 - 6.4          Diabetes: >6.4          Glycemic control for adults with diabetes: <7.0          Passed - Cr in normal range and within 360 days    Creatinine, Ser  Date Value Ref Range Status  06/09/2024 0.77 0.44 - 1.00 mg/dL Final         Passed - Valid encounter within last 6 months    Recent Outpatient Visits           2 months ago Type 2 diabetes mellitus with hyperglycemia, without long-term current use of insulin  [facility])   [place] [facility] [place], Lauraine SAILOR, DO   6 months ago Type 2 diabetes mellitus with hyperglycemia, without long-term current use of insulin  [facility])   [place] [facility] [place], Lauraine SAILOR, DO   7 months ago Edema, unspecified type   [facility] Nancyann BRAVO, MD   7 months ago Type 2 diabetes mellitus with hyperglycemia, without long-term current use of insulin  [facility])   [facility] [place], [place], PA-C       Future Appointments             In 2 weeks Pardue, Lauraine SAILOR, DO [place] [facility] [place]

## 2024-10-12 ENCOUNTER — Encounter: Admitting: Family Medicine

## 2024-10-22 ENCOUNTER — Other Ambulatory Visit: Payer: Self-pay | Admitting: Medical Genetics

## 2024-10-28 ENCOUNTER — Ambulatory Visit (INDEPENDENT_AMBULATORY_CARE_PROVIDER_SITE_OTHER): Admitting: Family Medicine

## 2024-10-28 ENCOUNTER — Encounter: Payer: Self-pay | Admitting: Family Medicine

## 2024-10-28 ENCOUNTER — Other Ambulatory Visit
Admission: RE | Admit: 2024-10-28 | Discharge: 2024-10-28 | Disposition: A | Discharge status: Home / Self Care | Payer: Self-pay | Source: Ambulatory Visit | Attending: Medical Genetics | Admitting: Medical Genetics

## 2024-10-28 VITALS — BP 97/55 | HR 84 | Temp 98.3°F | Ht 62.0 in | Wt 139.0 lb

## 2024-10-28 DIAGNOSIS — F331 Major depressive disorder, recurrent, moderate: Secondary | ICD-10-CM

## 2024-10-28 DIAGNOSIS — Z23 Encounter for immunization: Secondary | ICD-10-CM

## 2024-10-28 DIAGNOSIS — E1165 Type 2 diabetes mellitus with hyperglycemia: Secondary | ICD-10-CM

## 2024-10-28 DIAGNOSIS — I10 Essential (primary) hypertension: Secondary | ICD-10-CM

## 2024-10-28 DIAGNOSIS — N83201 Unspecified ovarian cyst, right side: Secondary | ICD-10-CM

## 2024-10-28 DIAGNOSIS — G479 Sleep disorder, unspecified: Secondary | ICD-10-CM

## 2024-10-28 DIAGNOSIS — E559 Vitamin D deficiency, unspecified: Secondary | ICD-10-CM

## 2024-10-28 DIAGNOSIS — E1169 Type 2 diabetes mellitus with other specified complication: Secondary | ICD-10-CM

## 2024-10-28 DIAGNOSIS — Z0000 Encounter for general adult medical examination without abnormal findings: Secondary | ICD-10-CM

## 2024-10-28 DIAGNOSIS — F419 Anxiety disorder, unspecified: Secondary | ICD-10-CM

## 2024-10-28 MED ORDER — GABAPENTIN 300 MG PO CAPS: 300.0000 mg | ORAL_CAPSULE | Freq: Every day | ORAL | 3 refills | Status: AC

## 2024-10-28 MED ORDER — LOSARTAN POTASSIUM 25 MG PO TABS: 25.0000 mg | ORAL_TABLET | Freq: Every day | ORAL | 0 refills | Status: AC

## 2024-10-28 NOTE — Progress Notes (Signed)
 Complete physical exam   Patient: Michelle Hendricks   DOB: 09/20/1966   58 y.o. Female  MRN: [id number] Visit Date: 10/28/2024  Today's healthcare provider: LAURAINE LOISE BUOY, DO   Chief Complaint  Patient presents with   Annual Exam    Diet- General Exercise- Gym 3 times a week, toning muscle Overall feeling- Okay, still nauseous Sleep- Less sleeping due to menopause Concerns- Wants to discuss menopausal.  Shingles- yes Hepatitis B- declined   Subjective    Michelle Hendricks is a 58 y.o. female who presents today for a complete physical exam.   HPI HPI     Annual Exam    Additional comments: Diet- General Exercise- Gym 3 times a week, toning muscle Overall feeling- Okay, still nauseous Sleep- Less sleeping due to menopause Concerns- Wants to discuss menopausal.  Shingles- yes Hepatitis B- declined      Last edited by Terrel Powell CROME, CMA on 10/28/2024  3:40 PM.       Michelle Hendricks is a 58 year old female who presents with mood changes and hair loss.  She has been experiencing significant mood changes and hair loss. Her hair has become very thin and has been falling out in clumps since the beginning of the year. Despite being on citalopram  40 mg for mood and lamotrigine  50 mg daily for about a year, she continues to feel down and lacks energy, feeling tired all the time.  She has been in menopause for about ten years, with no periods for a long time, and does not experience hot flashes. She reports no sexual urges and has not been sexually active, but does not experience dryness or discomfort.  She is currently taking lisinopril  for hypertension. She also takes metoclopramide  as needed for nausea associated with Ozempic , which she uses for blood sugar management. Her blood sugars have been stable.  She experiences trouble sleeping, only managing four to five hours per night, and often wakes up at 3 AM. No chest pain, shortness of breath, headaches, or abnormal heartbeats, but  she does report hearing her heartbeat in her ear and occasional ringing, mainly in one ear.  She has a history of smoking. She denies any history of blood clots. She has been told she was borderline for thyroid  issues in the past, but recent tests have been normal.     Past Medical History:  Diagnosis Date   Anxiety    Depression    Diabetes mellitus without complication (HCC)    Hypertension    Past Surgical History:  Procedure Laterality Date   NASAL SINUS SURGERY     Social History   Socioeconomic History   Marital status: Married    Spouse name: Not on file   Number of children: Not on file   Years of education: Not on file   Highest education level: Not on file  Occupational History   Not on file  Tobacco Use   Smoking status: Some Days    Current packs/day: 0.75    Average packs/day: 0.7 packs/day for 22.9 years (17.2 ttl pk-yrs)    Types: Cigarettes    Start date: 2003   Smokeless tobacco: Never   Tobacco comments:    Has cut back to 1/2 pack a day.   Substance and Sexual Activity   Alcohol use: Yes    Alcohol/week: 2.0 standard drinks of alcohol    Types: 2 Shots of liquor per week    Comment: Rarely  Drug use: Never   Sexual activity: Yes    Partners: Male    Birth control/protection: None  Other Topics Concern   Not on file  Social History Narrative   Not on file   Social Drivers of Health   Financial Resource Strain: High Risk (10/28/2024)   Overall Financial Resource Strain (CARDIA)    Difficulty of Paying Living Expenses: Hard  Food Insecurity: No Food Insecurity (10/28/2024)   Hunger Vital Sign    Worried About Running Out of Food in the Last Year: Never true    Ran Out of Food in the Last Year: Never true  Transportation Needs: No Transportation Needs (10/28/2024)   PRAPARE - Administrator, Civil Service (Medical): No    Lack of Transportation (Non-Medical): No  Physical Activity: Sufficiently Active (10/28/2024)   Exercise  Vital Sign    Days of Exercise per Week: 3 days    Minutes of Exercise per Session: 60 min  Stress: Stress Concern Present (10/28/2024)   [employer] of Occupational Health - Occupational Stress Questionnaire    Feeling of Stress: Very much  Social Connections: Moderately Integrated (10/28/2024)   Social Connection and Isolation Panel    Frequency of Communication with Friends and Family: Three times a week    Frequency of Social Gatherings with Friends and Family: Once a week    Attends Religious Services: 1 to 4 times per year    Active Member of [employer] or Organizations: No    Attends Banker Meetings: Never    Marital Status: Married  Catering Manager Violence: Not At Risk (10/28/2024)   Humiliation, Afraid, Rape, and Kick questionnaire    Fear of Current or Ex-Partner: No    Emotionally Abused: No    Physically Abused: No    Sexually Abused: No   Family Status  Relation Name Status   MGM  Deceased   MGF  Deceased   Mother  Alive   Father  Deceased at age 65       2015-08-20   PGM  Deceased   PGF  Deceased  No partnership data on file   Family History  Problem Relation Age of Onset   Alzheimer's disease Maternal Grandmother    Cancer Maternal Grandfather    Diabetes Maternal Grandfather    Heart attack Father    Allergies  Allergen Reactions   Penicillins Rash and Other (See Comments)    Has patient had a PCN reaction causing immediate rash, facial/tongue/throat swelling, SOB or lightheadedness with hypotension: No Has patient had a PCN reaction causing severe rash involving mucus membranes or skin necrosis: No Has patient had a PCN reaction that required hospitalization No Has patient had a PCN reaction occurring within the last 10 years: No If all of the above answers are NO, then may proceed with Cephalosporin use.    Patient Care Team: Donzella Lauraine SAILOR, DO as PCP - General (Family Medicine) [facility], [facility] (Optometry)    Medications: Outpatient Medications Prior to Visit  Medication Sig Note   citalopram  (CELEXA ) 40 MG tablet TAKE 1 TABLET BY MOUTH EVERY DAY    Glucose Blood (BLOOD GLUCOSE TEST STRIPS) STRP 1 each by In Vitro route in the morning, at noon, and at bedtime. May substitute to any manufacturer covered by patient's insurance.    hydrocortisone  (ANUSOL -HC) 2.5 % rectal cream Place 1 Application rectally 2 (two) times daily as needed for hemorrhoids or anal itching.    lamoTRIgine  (LAMICTAL ) 25  MG tablet TAKE 2 TABLETS BY MOUTH EVERY DAY    metoCLOPramide  (REGLAN ) 10 MG tablet Take 1 tablet (10 mg total) by mouth 3 (three) times daily with meals.    promethazine  (PHENERGAN ) 25 MG suppository Place 1 suppository (25 mg total) rectally every 6 (six) hours as needed for nausea or vomiting.    Semaglutide ,0.25 or 0.5MG /DOS, (OZEMPIC , 0.25 OR 0.5 MG/DOSE,) 2 MG/3ML SOPN INJECT 0.25 MG INTO THE SKIN WEEKLY FOR 4 WEEKS, THEN, IF TOLERATING WELL, INCREASE TO 0.5 MG WEEKLY    simvastatin  (ZOCOR ) 80 MG tablet TAKE 1 TABLET BY MOUTH EVERY DAY    [DISCONTINUED] lisinopril  (ZESTRIL ) 20 MG tablet Take 1 tablet (20 mg total) by mouth daily. 10/28/2024: hair loss   [DISCONTINUED] Choline Fenofibrate (FENOFIBRIC ACID) 135 MG CPDR Take 1 capsule by mouth daily.    [DISCONTINUED] levocetirizine (XYZAL ) 5 MG tablet Take 1 tablet (5 mg total) by mouth every evening. (Patient not taking: Reported on 07/10/2024)    No facility-administered medications prior to visit.    Review of Systems  Constitutional:  Negative for chills, fatigue and fever.  HENT:  Positive for tinnitus (intermittent). Negative for congestion, ear pain, rhinorrhea, sneezing and sore throat.   Eyes: Negative.  Negative for pain and redness.  Respiratory:  Negative for cough, shortness of breath and wheezing.   Cardiovascular:  Negative for chest pain, palpitations and leg swelling.  Gastrointestinal:  Positive for nausea (managed with  metoclopramide ). Negative for abdominal pain, blood in stool, constipation and diarrhea.  Endocrine: Negative for polydipsia and polyphagia.  Genitourinary: Negative.  Negative for dysuria, flank pain, hematuria, pelvic pain, vaginal bleeding and vaginal discharge.  Musculoskeletal:  Negative for arthralgias, back pain, gait problem and joint swelling.  Skin:  Negative for rash.  Neurological: Negative.  Negative for dizziness, tremors, seizures, weakness, light-headedness, numbness and headaches.  Hematological:  Negative for adenopathy.  Psychiatric/Behavioral:  Positive for dysphoric mood. Negative for behavioral problems and confusion. The patient is nervous/anxious. The patient is not hyperactive.       Objective    BP (!) 97/55 (BP Location: Left Arm, Patient Position: Sitting, Cuff Size: Normal)   Pulse 84   Temp 98.3 F (36.8 C) (Oral)   Ht 5' 2 (1.575 m)   Wt 139 lb (63 kg)   LMP 04/16/2015   SpO2 98%   BMI 25.42 kg/m    Physical Exam Vitals and nursing note reviewed.  Constitutional:      General: She is awake.     Appearance: Normal appearance.  HENT:     Head: Normocephalic and atraumatic.     Right Ear: Tympanic membrane, ear canal and external ear normal.     Left Ear: Tympanic membrane, ear canal and external ear normal.     Nose: Nose normal.     Mouth/Throat:     Mouth: Mucous membranes are moist.     Pharynx: Oropharynx is clear. No oropharyngeal exudate or posterior oropharyngeal erythema.  Eyes:     General: No scleral icterus.    Extraocular Movements: Extraocular movements intact.     Conjunctiva/sclera: Conjunctivae normal.     Pupils: Pupils are equal, round, and reactive to light.  Neck:     Thyroid : No thyromegaly or thyroid  tenderness.  Cardiovascular:     Rate and Rhythm: Normal rate and regular rhythm.     Pulses: Normal pulses.     Heart sounds: Normal heart sounds.  Pulmonary:     Effort: Pulmonary effort is normal.  No tachypnea,  bradypnea or respiratory distress.     Breath sounds: Normal breath sounds. No stridor. No wheezing, rhonchi or rales.  Abdominal:     General: Bowel sounds are normal. There is no distension.     Palpations: Abdomen is soft. There is no mass.     Tenderness: There is no abdominal tenderness. There is no guarding.     Hernia: No hernia is present.  Musculoskeletal:     Cervical back: Normal range of motion and neck supple.     Right lower leg: No edema.     Left lower leg: No edema.  Lymphadenopathy:     Cervical: No cervical adenopathy.  Skin:    General: Skin is warm and dry.  Neurological:     Mental Status: She is alert and oriented to person, place, and time. Mental status is at baseline.  Psychiatric:        Mood and Affect: Mood normal.        Behavior: Behavior normal.      Last depression screening scores    10/28/2024    3:53 PM 07/10/2024    4:05 PM 02/18/2024    9:45 AM  PHQ 2/9 Scores  PHQ - 2 Score 2 4 2   PHQ- 9 Score 6 9  7       Data saved with a previous flowsheet row definition   Last fall risk screening    10/28/2024    3:53 PM  Fall Risk   Falls in the past year? 0  Number falls in past yr: 0  Injury with Fall? 0  Risk for fall due to : No Fall Risks   Last Audit-C alcohol use screening    10/28/2024    3:58 PM  Alcohol Use Disorder Test (AUDIT)  1. How often do you have a drink containing alcohol? 0  2. How many drinks containing alcohol do you have on a typical day when you are drinking? 0  3. How often do you have six or more drinks on one occasion? 1  AUDIT-C Score 1   A score of 3 or more in women, and 4 or more in men indicates increased risk for alcohol abuse, EXCEPT if all of the points are from question 1   No results found for any visits on 10/28/24.  Assessment & Plan    Routine Health Maintenance and Physical Exam  Exercise Activities and Dietary recommendations  Goals   None     Immunization History  Administered  Date(s) Administered   Moderna Sars-Covid-2 Vaccination 10/29/2020   PFIZER(Purple Top)SARS-COV-2 Vaccination 02/28/2020, 03/20/2020   PNEUMOCOCCAL CONJUGATE-20 03/09/2024   Tdap 03/09/2024   Zoster Recombinant(Shingrix ) 03/09/2024, 10/28/2024    Health Maintenance  Topic Date Due   Diabetic kidney evaluation - Urine ACR  08/13/2024   Colonoscopy  11/07/2024 (Originally 11/28/2012)   Cervical Cancer Screening (HPV/Pap Cotest)  12/09/2024 (Originally 11/28/1997)   Mammogram  12/09/2024 (Originally 11/29/2007)   Influenza Vaccine  03/02/2025 (Originally 07/03/2024)   COVID-19 Vaccine (4 - 2025-26 season) 08/03/2025 (Originally 08/03/2024)   Hepatitis B Vaccines 19-59 Average Risk (1 of 3 - 19+ 3-dose series) 10/28/2025 (Originally 11/28/1986)   FOOT EXAM  11/07/2024   HEMOGLOBIN A1C  01/10/2025   OPHTHALMOLOGY EXAM  04/15/2025   Diabetic kidney evaluation - eGFR measurement  06/09/2025   DTaP/Tdap/Td (2 - Td or Tdap) 03/09/2034   Pneumococcal Vaccine: 50+ Years  Completed   HIV Screening  Completed   Zoster Vaccines- Shingrix   Completed   HPV VACCINES  Aged Out   Meningococcal B Vaccine  Aged Out   Hepatitis C Screening  Discontinued    Discussed health benefits of physical activity, and encouraged her to engage in regular exercise appropriate for her age and condition.   Annual physical exam  Type 2 diabetes mellitus with hyperglycemia, without long-term current use of insulin  (HCC) -     Microalbumin / creatinine urine ratio -     Hemoglobin A1c  Hyperlipidemia associated with type 2 diabetes mellitus (HCC) -     Lipid panel  Primary hypertension -     Comprehensive metabolic panel with GFR -     Losartan  Potassium; Take 1 tablet (25 mg total) by mouth daily.  Dispense: 90 tablet; Refill: 0  Vitamin D  deficiency -     VITAMIN D  25 Hydroxy (Vit-D Deficiency, Fractures)  Need for shingles vaccine -     Varicella-zoster vaccine IM  Sleep disturbance -     Gabapentin ;  Take 1 capsule (300 mg total) by mouth at bedtime.  Dispense: 30 capsule; Refill: 3  Anxiety -     Gabapentin ; Take 1 capsule (300 mg total) by mouth at bedtime.  Dispense: 30 capsule; Refill: 3  Moderate recurrent major depression (HCC) -     Gabapentin ; Take 1 capsule (300 mg total) by mouth at bedtime.  Dispense: 30 capsule; Refill: 3  Cyst of right ovary -     US  PELVIC COMPLETE WITH TRANSVAGINAL; Future      Annual physical exam Routine wellness visit.  Physical exam overall unremarkable except as noted above. Routine lab work ordered as noted.   - Continue routine wellness visits.  Type 2 diabetes mellitus with hyperglycemia and other specified complication Type 2 diabetes managed with Ozempic . Reports nausea managed with metoclopramide . Blood sugars well-controlled. - Continue Ozempic  at current dose (0.5 mg weekly). - Continue metoclopramide  as needed for nausea. - Check A1c today.  Primary hypertension; hair loss Hypertension managed with lisinopril . Blood pressure fluctuating, possibly contributing to fatigue.  Recent hair loss noted; differential includes possible side effect of lisinopril  versus relationship to postmenopausal state - Discontinued lisinopril . - Started losartan  for blood pressure management with reduced dose due to patient fatigue and low blood pressures.  Menopausal state with mood disturbance and hair loss Menopausal symptoms include mood disturbance and hair loss. Estrogen therapy not recommended due to smoking history. Citalopram  and lamotrigine  may need to be adjusted to address mood symptoms; defer to specialist management. Gabapentin  considered for mood and sleep issues. - Switched from lisinopril  to losartan  to address hair loss. - Started gabapentin  at night to help with mood and sleep. - Patient follows with psychiatry; defer to specialist management  Moderate recurrent major depression; anxiety  Mood symptoms persist despite current  treatment. Bupropion considered for potential benefits on mood and sexual desire. - Consider bupropion as an adjunct therapy for mood and sexual desire. - Patient follows with psychiatry; defer to specialist management  Sleep disturbance Difficulty sleeping, averaging 4-5 hours per night. Gabapentin  considered to improve sleep and mood. - Started gabapentin  at night to improve sleep.  Encounter for immunization Shingles vaccine administered.  Cyst of right ovary CT abd/pelvis w/contrast 12/05/23 showed Cystic lesion in the right adnexa measures 5.4 x 3.2 cm, previously 5.8 x 3.7 cm 18 months prior.  Ultrasound follow-up for recheck recommended 6 to 72-month mark.  Will order today.     Return in about 3 months (around 01/28/2025) for DM, HTN,  Chronic f/u, and in 6 months for Chronic f/u w/new provider.     I discussed the assessment and treatment plan with the patient  The patient was provided an opportunity to ask questions and all were answered. The patient agreed with the plan and demonstrated an understanding of the instructions.   The patient was advised to call back or seek an in-person evaluation if the symptoms worsen or if the condition fails to improve as anticipated.    LAURAINE LOISE BUOY, DO  [facility] [phone] (phone) [phone] (fax)  [facility]

## 2024-11-03 ENCOUNTER — Other Ambulatory Visit: Payer: Self-pay | Admitting: Family Medicine

## 2024-11-03 ENCOUNTER — Ambulatory Visit: Payer: Self-pay | Admitting: Family Medicine

## 2024-11-03 DIAGNOSIS — E1169 Type 2 diabetes mellitus with other specified complication: Secondary | ICD-10-CM

## 2024-11-03 LAB — HEMOGLOBIN A1C
Est. average glucose Bld gHb Est-mCnc: 131 mg/dL
Hgb A1c MFr Bld: 6.2 % — ABNORMAL HIGH (ref 4.8–5.6)

## 2024-11-03 LAB — COMPREHENSIVE METABOLIC PANEL WITH GFR
ALT: 18 IU/L (ref 0–32)
AST: 23 IU/L (ref 0–40)
Albumin: 4.3 g/dL (ref 3.8–4.9)
Alkaline Phosphatase: 65 IU/L (ref 49–135)
BUN/Creatinine Ratio: 18 (ref 9–23)
BUN: 15 mg/dL (ref 6–24)
Bilirubin Total: 0.3 mg/dL (ref 0.0–1.2)
CO2: 24 mmol/L (ref 20–29)
Calcium: 9.4 mg/dL (ref 8.7–10.2)
Chloride: 102 mmol/L (ref 96–106)
Creatinine, Ser: 0.83 mg/dL (ref 0.57–1.00)
Globulin, Total: 2 g/dL (ref 1.5–4.5)
Glucose: 114 mg/dL — ABNORMAL HIGH (ref 70–99)
Potassium: 4.5 mmol/L (ref 3.5–5.2)
Sodium: 139 mmol/L (ref 134–144)
Total Protein: 6.3 g/dL (ref 6.0–8.5)
eGFR: 83 mL/min/1.73 (ref >59)

## 2024-11-03 LAB — LIPID PANEL
Chol/HDL Ratio: 3 ratio (ref 0.0–4.4)
Cholesterol, Total: 111 mg/dL (ref 100–199)
HDL: 37 mg/dL — ABNORMAL LOW (ref >39)
LDL Chol Calc (NIH): 43 mg/dL (ref 0–99)
Triglycerides: 193 mg/dL — ABNORMAL HIGH (ref 0–149)
VLDL Cholesterol Cal: 31 mg/dL (ref 5–40)

## 2024-11-03 LAB — MICROALBUMIN / CREATININE URINE RATIO
Creatinine, Urine: 315.1 mg/dL
Microalb/Creat Ratio: 8 mg/g{creat} (ref 0–29)
Microalbumin, Urine: 24.2 ug/mL

## 2024-11-03 LAB — VITAMIN D 25 HYDROXY (VIT D DEFICIENCY, FRACTURES): Vit D, 25-Hydroxy: 58.5 ng/mL (ref 30.0–100.0)

## 2024-11-06 ENCOUNTER — Ambulatory Visit: Admission: RE | Admit: 2024-11-06

## 2024-11-10 LAB — GENECONNECT MOLECULAR SCREEN: Genetic Analysis Overall Interpretation: NEGATIVE

## 2024-12-07 ENCOUNTER — Telehealth (HOSPITAL_COMMUNITY): Payer: Self-pay | Admitting: Psychiatry

## 2024-12-07 MED ORDER — LAMOTRIGINE 25 MG PO TABS: 50.0000 mg | ORAL_TABLET | Freq: Every day | ORAL | 0 refills | Status: AC

## 2024-12-07 NOTE — Telephone Encounter (Signed)
 Received fax from patient's pharmacy requesting refill of lamoTRIgine  (LAMICTAL ) 25 MG tablet .   [id number] [place], [place] - [phone] (Ph: [phone])   Last ordered: 09/09/2024 - 180 tablets Last visit: 06/16/2024 Next visit: 12/15/2024

## 2024-12-09 ENCOUNTER — Telehealth: Payer: Self-pay

## 2024-12-09 DIAGNOSIS — F32A Depression, unspecified: Secondary | ICD-10-CM

## 2024-12-09 DIAGNOSIS — F419 Anxiety disorder, unspecified: Secondary | ICD-10-CM

## 2024-12-09 MED ORDER — CITALOPRAM HYDROBROMIDE 40 MG PO TABS: 40.0000 mg | ORAL_TABLET | Freq: Every day | ORAL | 0 refills | Status: AC

## 2024-12-09 NOTE — Telephone Encounter (Signed)
 Received fax requesting a refill of citalopram  (CELEXA ) 40 MG tablet   Last visit 06-16-24 Next visit 12-15-24     Preferred Pharmacies   [id number] [place], [place] - [address] Phone: [phone]  Fax: [phone]

## 2024-12-15 ENCOUNTER — Ambulatory Visit (HOSPITAL_COMMUNITY): Admitting: Psychiatry

## 2024-12-18 ENCOUNTER — Other Ambulatory Visit: Payer: Self-pay | Admitting: Family Medicine

## 2024-12-18 DIAGNOSIS — E1165 Type 2 diabetes mellitus with hyperglycemia: Secondary | ICD-10-CM

## 2024-12-18 DIAGNOSIS — E1169 Type 2 diabetes mellitus with other specified complication: Secondary | ICD-10-CM

## 2024-12-18 MED ORDER — OZEMPIC (0.25 OR 0.5 MG/DOSE) 2 MG/3ML ~~LOC~~ SOPN: PEN_INJECTOR | SUBCUTANEOUS | 2 refills | Status: AC

## 2024-12-18 NOTE — Telephone Encounter (Signed)
 Copied from CRM #[id number]. Topic: Clinical - Medication Refill >> Dec 18, 2024 10:38 AM Laymon HERO wrote: Medication: Semaglutide ,0.25 or 0.5MG /DOS, (OZEMPIC , 0.25 OR 0.5 MG/DOSE,) 2 MG/3ML SOPN  Has the patient contacted their pharmacy? Yes (Agent: If no, request that the patient contact the pharmacy for the refill. If patient does not wish to contact the pharmacy document the reason why and proceed with request.) (Agent: If yes, when and what did the pharmacy advise?)   [facility] - [place], [place] - [address] [place] [address] Phone: [phone] Fax: [phone]  Is this the correct pharmacy for this prescription? Yes If no, delete pharmacy and type the correct one.   Has the prescription been filled recently? Yes  Is the patient out of the medication? Yes  Has the patient been seen for an appointment in the last year OR does the patient have an upcoming appointment? Yes  Can we respond through MyChart? Yes  Agent: Please be advised that Rx refills may take up to 3 business days. We ask that you follow-up with your pharmacy.

## 2024-12-18 NOTE — Telephone Encounter (Signed)
 Requested Prescriptions  Pending Prescriptions Disp Refills   Semaglutide ,0.25 or 0.5MG /DOS, (OZEMPIC , 0.25 OR 0.5 MG/DOSE,) 2 MG/3ML SOPN 3 mL 2    Sig: Inject 0.25 mg into the skin weekly for 4 weeks, then, if tolerating well, increase to 0.5 mg weekly     Endocrinology:  Diabetes - GLP-1 Receptor Agonists - semaglutide  Failed - 12/18/2024  2:34 PM      Failed - HBA1C in normal range and within 180 days    Hemoglobin A1C  Date Value Ref Range Status  10/04/2017 8.2  Final    Comment:    followed by Dr. Damian    Hgb A1c MFr Bld  Date Value Ref Range Status  11/02/2024 6.2 (H) 4.8 - 5.6 % Final    Comment:             Prediabetes: 5.7 - 6.4          Diabetes: >6.4          Glycemic control for adults with diabetes: <7.0          Passed - Cr in normal range and within 360 days    Creatinine, Ser  Date Value Ref Range Status  11/02/2024 0.83 0.57 - 1.00 mg/dL Final         Passed - Valid encounter within last 6 months    Recent Outpatient Visits           1 month ago Annual physical exam   [facility] Hendricks, Michelle N, DO   5 months ago Type 2 diabetes mellitus with hyperglycemia, without long-term current use of insulin  [facility])   [place] [facility] Hendricks, Michelle SAILOR, DO   9 months ago Type 2 diabetes mellitus with hyperglycemia, without long-term current use of insulin  [facility])   [place] [facility] [place], Michelle SAILOR, DO   10 months ago Edema, unspecified type   [facility] [place] Michelle BRAVO, MD   10 months ago Type 2 diabetes mellitus with hyperglycemia, without long-term current use of insulin  [facility])   [place] [facility] [place], Janna, Michelle Hendricks

## 2025-01-29 ENCOUNTER — Ambulatory Visit: Admitting: Family Medicine

## 2025-05-04 ENCOUNTER — Encounter: Admitting: Family Medicine
